# Patient Record
Sex: Male | Born: 1945 | Race: White | Hispanic: No | Smoking: Current every day smoker
Health system: Southern US, Community
[De-identification: ages and names within clinical notes are randomized; demographics above are authoritative.]

## PROBLEM LIST (undated history)

## (undated) DIAGNOSIS — C801 Malignant (primary) neoplasm, unspecified: Secondary | ICD-10-CM

## (undated) DIAGNOSIS — C8330 Diffuse large B-cell lymphoma, unspecified site: Secondary | ICD-10-CM

## (undated) HISTORY — DX: Diffuse large B-cell lymphoma, unspecified site: C83.30

---

## 2010-08-19 ENCOUNTER — Other Ambulatory Visit (HOSPITAL_COMMUNITY): Payer: Self-pay | Admitting: Family Medicine

## 2010-08-19 ENCOUNTER — Ambulatory Visit (HOSPITAL_COMMUNITY)
Admission: RE | Admit: 2010-08-19 | Discharge: 2010-08-19 | Disposition: A | Discharge status: Home / Self Care | Payer: 59 | Source: Ambulatory Visit | Attending: Family Medicine | Admitting: Family Medicine

## 2010-08-19 DIAGNOSIS — M949 Disorder of cartilage, unspecified: Secondary | ICD-10-CM | POA: Insufficient documentation

## 2010-08-19 DIAGNOSIS — R0989 Other specified symptoms and signs involving the circulatory and respiratory systems: Secondary | ICD-10-CM | POA: Insufficient documentation

## 2010-08-19 DIAGNOSIS — M546 Pain in thoracic spine: Secondary | ICD-10-CM | POA: Insufficient documentation

## 2010-08-19 DIAGNOSIS — R109 Unspecified abdominal pain: Secondary | ICD-10-CM | POA: Insufficient documentation

## 2010-08-19 DIAGNOSIS — M899 Disorder of bone, unspecified: Secondary | ICD-10-CM | POA: Insufficient documentation

## 2010-08-19 DIAGNOSIS — M542 Cervicalgia: Secondary | ICD-10-CM

## 2012-12-22 ENCOUNTER — Ambulatory Visit (HOSPITAL_COMMUNITY)
Admission: RE | Admit: 2012-12-22 | Discharge: 2012-12-22 | Disposition: A | Discharge status: Home / Self Care | Payer: Medicare Other | Source: Ambulatory Visit | Attending: Family Medicine | Admitting: Family Medicine

## 2012-12-22 ENCOUNTER — Other Ambulatory Visit (HOSPITAL_COMMUNITY): Payer: Self-pay | Admitting: Family Medicine

## 2012-12-22 DIAGNOSIS — M25551 Pain in right hip: Secondary | ICD-10-CM

## 2012-12-22 DIAGNOSIS — M25559 Pain in unspecified hip: Secondary | ICD-10-CM | POA: Insufficient documentation

## 2013-02-04 ENCOUNTER — Emergency Department (HOSPITAL_COMMUNITY)
Admission: EM | Admit: 2013-02-04 | Discharge: 2013-02-05 | Disposition: A | Discharge status: Home / Self Care | Payer: Medicare Other | Attending: Emergency Medicine | Admitting: Emergency Medicine

## 2013-02-04 ENCOUNTER — Emergency Department (HOSPITAL_COMMUNITY): Payer: Medicare Other

## 2013-02-04 ENCOUNTER — Encounter (HOSPITAL_COMMUNITY): Payer: Self-pay | Admitting: Emergency Medicine

## 2013-02-04 DIAGNOSIS — M5430 Sciatica, unspecified side: Secondary | ICD-10-CM | POA: Insufficient documentation

## 2013-02-04 DIAGNOSIS — M5431 Sciatica, right side: Secondary | ICD-10-CM

## 2013-02-04 DIAGNOSIS — F17200 Nicotine dependence, unspecified, uncomplicated: Secondary | ICD-10-CM | POA: Insufficient documentation

## 2013-02-04 NOTE — ED Notes (Signed)
Persistent pain right hip for 2 months, has been on prednisone therapy since 12/14. Pain increased and hasn't been able to sleep

## 2013-02-05 NOTE — Discharge Instructions (Signed)
X-ray was okay .  You have sciatica.   Phone number for Dr. Aline Brochure  given locally.   You can also call  [place] [facility] for followup.

## 2013-02-05 NOTE — ED Provider Notes (Signed)
CSN: [id number]     Arrival date & time 02/04/13  2235 History  This chart was scribed for Nat Christen, MD by Jenne Campus, ED Scribe. This patient was seen in room [facility]2/[facility] and the patient's care was started at 12:03 AM.     Chief Complaint  Patient presents with  . Hip Pain    The history is provided by the patient. No language interpreter was used.    HPI Comments: Devin Zuniga is a 68 y.o. male who presents to the Emergency Department complaining of persistent right hip pain for the past 2 months. He states that f/u with Dr. Karie Kirks for the pain that was originally in the anterior thigh and was given IBU with improvement in the front. He now has posterior pain that radiates all the way down to the right foot. He states that he was then prescribed prednisone with improvement in the radiation, but he states that the hip is still getting "tender and stiff". He reports that 2 days ago he was out walking with a cane which worsened the pain. He has been using a walker as well with some improvement. He states the he was prescribed hydrocodone but does not like taking it due to the side effect of constipation.   History reviewed. No pertinent past medical history. History reviewed. No pertinent past surgical history. No family history on file. History  Substance Use Topics  . Smoking status: Current Every Day Smoker -- 0.50 packs/day  . Smokeless tobacco: Not on file  . Alcohol Use: No    Review of Systems  A complete 10 system review of systems was obtained and all systems are negative except as noted in the HPI and PMH.   Allergies  Review of patient's allergies indicates no known allergies.  Home Medications  No current outpatient prescriptions on file.  Triage Vitals: BP 152/95  Pulse 114  Temp(Src) 98.1 F (36.7 C) (Oral)  Resp 16  Ht 5\' 10"  (1.778 m)  Wt 165 lb (74.844 kg)  BMI 23.68 kg/m2  SpO2 100%  Physical Exam  Nursing note and vitals  reviewed. Constitutional: He is oriented to person, place, and time. He appears well-developed and well-nourished.  HENT:  Head: Normocephalic and atraumatic.  Eyes: Conjunctivae and EOM are normal.  Neck: Normal range of motion. Neck supple.  Cardiovascular: Normal rate.   Pulmonary/Chest: Effort normal.  Musculoskeletal: Normal range of motion.  Tender along the distrubution of the sciatic nerve on the right  Neurological: He is alert and oriented to person, place, and time.  Skin: Skin is warm and dry.  Psychiatric: He has a normal mood and affect. His behavior is normal.    ED Course  Procedures (including critical care time)  DIAGNOSTIC STUDIES: Oxygen Saturation is 100% on RA, normal by my interpretation.    COORDINATION OF CARE: 12:06 AM- Advised pt that his symptoms seem to be sciatica. Offered pt Toradol and sleeping mediation which pt declined. Advised pt that he needs to f/u with Orthopedics.   Labs Review Labs Reviewed - No data to display Imaging Review No results found.  EKG Interpretation   None       MDM  No diagnosis found. X-ray of right hip shows no acute pathology.  History and physical consistent with sciatica.  I personally performed the services described in this documentation, which was scribed in my presence. The recorded information has been reviewed and is accurate.    Nat Christen, MD 02/09/13 [phone]

## 2013-02-20 ENCOUNTER — Encounter: Payer: Self-pay | Admitting: Orthopedic Surgery

## 2013-02-20 ENCOUNTER — Ambulatory Visit (INDEPENDENT_AMBULATORY_CARE_PROVIDER_SITE_OTHER): Payer: Medicare Other | Admitting: Orthopedic Surgery

## 2013-02-20 ENCOUNTER — Ambulatory Visit (INDEPENDENT_AMBULATORY_CARE_PROVIDER_SITE_OTHER): Payer: Medicare Other

## 2013-02-20 VITALS — BP 121/82 | Ht 70.0 in | Wt 159.0 lb

## 2013-02-20 DIAGNOSIS — M25551 Pain in right hip: Secondary | ICD-10-CM

## 2013-02-20 DIAGNOSIS — M549 Dorsalgia, unspecified: Secondary | ICD-10-CM

## 2013-02-20 DIAGNOSIS — M25559 Pain in unspecified hip: Secondary | ICD-10-CM

## 2013-02-20 DIAGNOSIS — G5700 Lesion of sciatic nerve, unspecified lower limb: Secondary | ICD-10-CM

## 2013-02-20 DIAGNOSIS — G5701 Lesion of sciatic nerve, right lower limb: Secondary | ICD-10-CM

## 2013-02-20 NOTE — Patient Instructions (Addendum)
You have received a steroid shot. 15% of patients experience increased pain at the injection site with in the next 24 hours. This is best treated with ice and tylenol extra strength 2 tabs every 8 hours. If you are still having pain please call the office.  Piriformis Syndrome   Piriformis syndrome is a condition the affects the nervous system in the area of the hip, and is characterized by pain and possibly a loss of feeling in the backside (posterior) thigh that may extend down the entire length of the leg. The symptoms are caused by an increase in pressure on the sciatic nerve by the piriformis muscle, which is on the back of the hip and is responsible for externally rotating the hip. The sciatic nerve and its branches connect to much of the leg. Normally the sciatic nerve runs between the piriformis muscle and other muscles. However, in certain individuals the nerve runs through the muscle, which causes an increase in pressure on the nerve and results in the symptoms of piriformis syndrome. SYMPTOMS   Pain, tingling, numbness, or burning in the back of the thigh that may also extend down the entire leg.  Occasionally, tenderness in the buttock.  Loss of function of the leg.  Pain that worsens when using the piriformis muscle (running, jumping, or stairs).  Pain that increases with prolonged sitting.  Pain that is lessened by laying flat on the back. CAUSES   Piriformis syndrome is the result of an increase in pressure placed on the sciatic nerve. Often times piriformis syndrome is an overuse injury.  Stress placed on the nerve from a sudden increase in the intensity, frequency, or duration of training.  Compensation of other extremity injuries. RISK INCREASES WITH:  Sports that involve the piriformis muscle (running, walking or jumping).  You are born with (congenital) a defect in which the sciatic nerve passes through the muscle. PREVENTION  Warm up and stretch properly before  activity.  Allow for adequate recovery between workouts.  Maintain physical fitness:  Strength, flexibility, and endurance.  Cardiovascular fitness. PROGNOSIS  If treated properly, then the symptoms of piriformis syndrome usually resolve in 2 to 6 weeks. RELATED COMPLICATIONS   Persistent and possibly permanent pain and numbness in the lower extremity.  Weakness of the extremity that may progress to disability and inability to compete. TREATMENT  The most effective treatment for piriformis syndrome is rest from any activities that aggravate the symptoms. Ice and pain medication may help reduce pain and inflammation. The use of strengthening and stretching exercises may help reduce pain with activity. These exercises may be performed at home or with a therapist. A referral to a therapist may be given for further evaluation and treatment, such as ultrasound. Corticosteroid injections may be given to reduce inflammation that is causing pressure to be placed on the sciatic nerve. If non-surgical (conservative) treatment is unsuccessful, then surgery may be recommended.  MEDICATION   If pain medication is necessary, then nonsteroidal anti-inflammatory medications, such as aspirin and ibuprofen, or other minor pain relievers, such as acetaminophen, are often recommended.  Do not take pain medication for 7 days before surgery.  Prescription pain relievers may be given if deemed necessary by your caregiver. Use only as directed and only as much as you need.  Corticosteroid injections may be given by your caregiver. These injections should be reserved for the most serious cases, because they may only be given a certain number of times. HEAT AND COLD:   Cold treatment (  icing) relieves pain and reduces inflammation. Cold treatment should be applied for 10 to 15 minutes every 2 to 3 hours for inflammation and pain and immediately after any activity that aggravates your symptoms. Use ice packs or  massage the area with a piece of ice (ice massage).  Heat treatment may be used prior to performing the stretching and strengthening activities prescribed by your caregiver, physical therapist, or athletic trainer. Use a heat pack or soak the injury in warm water. SEEK IMMEDIATE MEDICAL CARE IF:  Treatment seems to offer no benefit, or the condition worsens.  Any medications produce adverse side effects.

## 2013-02-20 NOTE — Progress Notes (Signed)
Patient ID: Devin Zuniga, male   DOB: 1945-05-09, 68 y.o.   MRN: [id number] Chief Complaint  Patient presents with  . Hip Pain    Right hip pain, no injury. Consult from Dr. Karie Kirks    This is a 68 year old male no history of surgery no major medical problems who presents with a five-month history of pain in his right hip over the posterior lateral hip area and the greater trochanteric region and gluteal area which started after he did some yard work. He was treated with prednisone ibuprofen and hydrocodone. He did notice some relief until his prednisone taper was finished. He's had an x-ray of his hip in October again in January he has normal hip joints. He does not have groin pain her anterior thigh pain at this time his pain is confined to the greater trochanter just posterior to it in the area of the puriform [place]. He denies any numbness or tingling of his right lower extremity but he has noticed that flexing his hip and bending his knee does give him some relief  System review negative  Family history negative social history widowed she does smoke 5 or 6 cigars per day he does not drink  BP 121/82  Ht 5\' 10"  (1.778 m)  Wt 159 lb (72.122 kg)  BMI 22.81 kg/m2 This is a very nice gentleman he has an ectomorphic body habitus he appears to have good nutrition no developmental abnormalities he is well-groomed his pulses are good is extremities are warm to touch is no lymphadenopathy in the groin his skin is warm dry and intact and he is head and neck trunk area left and right lower extremities. He ambulates well he has a smooth gait and normal coordination of his ambulatory status except for the fact that he see it appears to limp on the right side reflexes and nerve stretch tests are normal he has normal sensation by touch and proprioception he is awake alert and oriented x3 his mood and affect are normal. He has no tenderness in his back he appears at least flexed lumbosacral junction  His left hip  on inspection looks normal nontender, range of motion is full, stability tests were normal and muscle strength and tone were normal  On the right side and he does have discomfort when we flattened his hip into full extension in his knee into extension. The stretch test was negative. He did have tenderness over the performance muscle and pain with internal rotation when lying on his side. Muscle tone was normal strength was normal stability tests were normal  X-rays reviewed include x-ray from the hospital in October another one in January and we took a series of lumbar spine films which shows extensive osteopenia but without significant disc space narrowing. We do see degenerative changes and abnormal coronal plane alignment but he is so osteopenic that it is difficult to assess his lumbar spine  Differential diagnosis includes degenerative disc disease, puriform syndrome, sciatica.  We injected him in the puriform  tendon area and we will see him back in 2 weeks to see if this helped  Injection right hip injection  Verbal consent was given and timeout to confirm procedure was completed  Medications used Depo-Medrol 40 mg and lidocaine 1% 3 cc  Technique of injection the skin was cleaned with alcohol and then sprayed with ethyl chloride a 25-gauge needle was used to inject the medication. There were no complications. A bandage was applied.

## 2013-03-13 ENCOUNTER — Encounter: Payer: Self-pay | Admitting: Orthopedic Surgery

## 2013-03-13 ENCOUNTER — Ambulatory Visit (INDEPENDENT_AMBULATORY_CARE_PROVIDER_SITE_OTHER): Payer: Medicare Other | Admitting: Orthopedic Surgery

## 2013-03-13 VITALS — BP 157/92 | Ht 70.0 in | Wt 159.0 lb

## 2013-03-13 DIAGNOSIS — M25551 Pain in right hip: Secondary | ICD-10-CM

## 2013-03-13 DIAGNOSIS — M25559 Pain in unspecified hip: Secondary | ICD-10-CM

## 2013-03-13 DIAGNOSIS — M549 Dorsalgia, unspecified: Secondary | ICD-10-CM

## 2013-03-13 NOTE — Progress Notes (Signed)
Patient ID: Devin Zuniga, male   DOB: [birth year], 68 y.o.   MRN: [id number] Chief Complaint  Patient presents with  . Follow-up    2 week recheck right hip s/p injection    BP 157/92  Ht 5\' 10"  (1.778 m)  Wt 159 lb (72.122 kg)  BMI 22.81 kg/m2  The patient reports injection has helped him immensely. He is walking better though still with a limp and still with a slightly flexed lumbosacral junction  He does have pain over the SI joint and gluteal area he put a Deformity day. Denies numbness and tingling. He's able to move his leg better and he can straighten his hip down and bring it into extension  I watched him walk he does have a slight limp the knees flexed slightly at the spine  Vital signs stable appearance is normal is oriented x3 his mood and affect are normal his gait as described as tenderness near the pier form is insertion. Skin is intact over this area.  Inject right hip IM injection  Sterile technique verbal consent and timeout completed  Alcohol prep ethyl chloride for anesthesia 40 mg of Depo-Medrol and 3 cc 1% lidocaine injected without complication followup in 2 weeks

## 2013-03-13 NOTE — Patient Instructions (Signed)
You have received a steroid shot. 15% of patients experience increased pain at the injection site with in the next 24 hours. This is best treated with ice and tylenol extra strength 2 tabs every 8 hours. If you are still having pain please call the office.    

## 2013-03-19 ENCOUNTER — Telehealth: Payer: Self-pay | Admitting: Orthopedic Surgery

## 2013-03-19 NOTE — Telephone Encounter (Signed)
Devin Zuniga said he got an injection in his right hip 03/13/13, and since then he has felt tightness in the upper part of his right leg, but today he has numbness there. He asked if this is normal.  He has been elevating and taking Ibuprofen.   Any further advice?

## 2013-03-21 NOTE — Telephone Encounter (Signed)
CONTINUE PRESENT COURSE

## 2013-03-21 NOTE — Telephone Encounter (Signed)
Called patient and advised him of Dr. Ruthe Mannan reply. Patient has follow up appointment 03/29/13.

## 2013-03-23 ENCOUNTER — Telehealth: Payer: Self-pay | Admitting: *Deleted

## 2013-03-23 NOTE — Telephone Encounter (Signed)
Patient called today with c/o hip still hurting. I advised him that Dr. Aline Brochure was out of the office today. He has an appointment Thursday 03/29/13. I advised him to keep that appointment, and I told him some of his arthritis pain may be worse from the extreme cold weather. He stated that DR. Karie Kirks had given him some hydrocodone, and he was taking those, which seemed to help.

## 2013-03-28 ENCOUNTER — Telehealth: Payer: Self-pay | Admitting: *Deleted

## 2013-03-28 ENCOUNTER — Other Ambulatory Visit (HOSPITAL_COMMUNITY): Payer: Self-pay | Admitting: Family Medicine

## 2013-03-28 ENCOUNTER — Ambulatory Visit (HOSPITAL_COMMUNITY)
Admission: RE | Admit: 2013-03-28 | Discharge: 2013-03-28 | Disposition: A | Discharge status: Home / Self Care | Payer: Medicare Other | Source: Ambulatory Visit | Attending: Family Medicine | Admitting: Family Medicine

## 2013-03-28 DIAGNOSIS — M7989 Other specified soft tissue disorders: Secondary | ICD-10-CM

## 2013-03-28 DIAGNOSIS — M79604 Pain in right leg: Secondary | ICD-10-CM

## 2013-03-28 DIAGNOSIS — M79609 Pain in unspecified limb: Secondary | ICD-10-CM | POA: Insufficient documentation

## 2013-03-28 DIAGNOSIS — R599 Enlarged lymph nodes, unspecified: Secondary | ICD-10-CM | POA: Insufficient documentation

## 2013-03-28 NOTE — Telephone Encounter (Signed)
Patient came in today complaining of right leg swelling.Marland Kitchen He has an appointment for Thursday 03/29/13, but he is concerned with the inclement weather they are calling for he may not be able to make it. He wanted to be seen today, however, Dr. Aline Brochure did not have anything due to he has a full schedule and  a 1:00 surgery. I did look at his leg, and there is a significant amount of swelling in his right leg, no redness noted.  I advised him to see his primary care doctor today or go to the ER to have this checked out.

## 2013-03-29 ENCOUNTER — Ambulatory Visit: Payer: Medicare Other | Admitting: Orthopedic Surgery

## 2013-03-30 ENCOUNTER — Encounter (HOSPITAL_COMMUNITY): Payer: Self-pay | Admitting: Emergency Medicine

## 2013-03-30 ENCOUNTER — Emergency Department (HOSPITAL_COMMUNITY): Payer: Medicare Other

## 2013-03-30 ENCOUNTER — Emergency Department (HOSPITAL_COMMUNITY)
Admission: EM | Admit: 2013-03-30 | Discharge: 2013-03-30 | Disposition: A | Discharge status: Home / Self Care | Payer: Medicare Other | Attending: Emergency Medicine | Admitting: Emergency Medicine

## 2013-03-30 DIAGNOSIS — F17200 Nicotine dependence, unspecified, uncomplicated: Secondary | ICD-10-CM | POA: Insufficient documentation

## 2013-03-30 DIAGNOSIS — M7989 Other specified soft tissue disorders: Secondary | ICD-10-CM | POA: Insufficient documentation

## 2013-03-30 LAB — CBC WITH DIFFERENTIAL/PLATELET
Basophils Absolute: 0 10*3/uL (ref 0.0–0.1)
Basophils Relative: 0 % (ref 0–1)
EOS PCT: 1 % (ref 0–5)
Eosinophils Absolute: 0.2 10*3/uL (ref 0.0–0.7)
HEMATOCRIT: 41.8 % (ref 39.0–52.0)
HEMOGLOBIN: 13.3 g/dL (ref 13.0–17.0)
LYMPHS ABS: 2.1 10*3/uL (ref 0.7–4.0)
LYMPHS PCT: 20 % (ref 12–46)
MCH: 27.9 pg (ref 26.0–34.0)
MCHC: 31.8 g/dL (ref 30.0–36.0)
MCV: 87.8 fL (ref 78.0–100.0)
MONO ABS: 0.6 10*3/uL (ref 0.1–1.0)
MONOS PCT: 6 % (ref 3–12)
Neutro Abs: 7.7 10*3/uL (ref 1.7–7.7)
Neutrophils Relative %: 73 % (ref 43–77)
Platelets: 348 10*3/uL (ref 150–400)
RBC: 4.76 MIL/uL (ref 4.22–5.81)
RDW: 14.7 % (ref 11.5–15.5)
WBC: 10.5 10*3/uL (ref 4.0–10.5)

## 2013-03-30 LAB — COMPREHENSIVE METABOLIC PANEL
ALT: 8 U/L (ref 0–53)
AST: 14 U/L (ref 0–37)
Albumin: 3.4 g/dL — ABNORMAL LOW (ref 3.5–5.2)
Alkaline Phosphatase: 98 U/L (ref 39–117)
BILIRUBIN TOTAL: 0.3 mg/dL (ref 0.3–1.2)
BUN: 10 mg/dL (ref 6–23)
CALCIUM: 9.5 mg/dL (ref 8.4–10.5)
CHLORIDE: 100 meq/L (ref 96–112)
CO2: 29 meq/L (ref 19–32)
CREATININE: 0.79 mg/dL (ref 0.50–1.35)
GLUCOSE: 96 mg/dL (ref 70–99)
Potassium: 3.8 mEq/L (ref 3.7–5.3)
Sodium: 141 mEq/L (ref 137–147)
Total Protein: 7.6 g/dL (ref 6.0–8.3)

## 2013-03-30 MED ORDER — IOHEXOL 300 MG/ML  SOLN
100.0000 mL | Freq: Once | INTRAMUSCULAR | Status: AC | PRN
  Administered 2013-03-30: 100 mL via INTRAVENOUS

## 2013-03-30 NOTE — ED Notes (Signed)
Pt states he left Dr. Yvonna Alanis office and was told to come here to get checked. States he has had his leg elevated and taking motrin

## 2013-03-30 NOTE — Discharge Instructions (Signed)
Take daily baby aspirin.  Follow up with Dr. Karie Kirks at 10 AM on Monday morning.

## 2013-03-30 NOTE — ED Provider Notes (Signed)
CSN: [id number]     Arrival date & time 03/30/13  1305 History   First MD Initiated Contact with Patient 03/30/13 1507     Chief Complaint  Patient presents with  . Leg Swelling     (Consider location/radiation/quality/duration/timing/severity/associated sxs/prior Treatment) HPI ... right leg swelling for approximately one week. Patient seen by primary care Dr. Today.   Doppler study of right lower extremity negative. No chest pain, dyspnea, fever, sweats, chills, weight loss. No chronic medical problems. Severity is moderate to severe. Nothing makes symptoms better or worse.   History reviewed. No pertinent past medical history. History reviewed. No pertinent past surgical history. No family history on file. History  Substance Use Topics  . Smoking status: Current Every Day Smoker -- 0.50 packs/day  . Smokeless tobacco: Not on file  . Alcohol Use: No    Review of Systems  All other systems reviewed and are negative.      Allergies  Review of patient's allergies indicates no known allergies.  Home Medications   Current Outpatient Rx  Name  Route  Sig  Dispense  Refill  . HYDROcodone-acetaminophen (NORCO) 10-325 MG per tablet   Oral   Take 1 tablet by mouth every 6 (six) hours as needed. pain         . ibuprofen (ADVIL,MOTRIN) 200 MG tablet   Oral   Take 400 mg by mouth every 6 (six) hours as needed. pain          BP 156/104  Pulse 107  Temp(Src) 97.7 F (36.5 C) (Oral)  Resp 20  Ht 5\' 10"  (1.778 m)  Wt 158 lb (71.668 kg)  BMI 22.67 kg/m2  SpO2 100% Physical Exam  Nursing note and vitals reviewed. Constitutional: He is oriented to person, place, and time. He appears well-developed and well-nourished.  HENT:  Head: Normocephalic and atraumatic.  Eyes: Conjunctivae and EOM are normal. Pupils are equal, round, and reactive to light.  Neck: Normal range of motion. Neck supple.  Cardiovascular: Normal rate, regular rhythm and normal heart sounds.    Pulmonary/Chest: Effort normal and breath sounds normal.  Abdominal: Soft. Bowel sounds are normal.  Musculoskeletal:  Right lower extremity tense and puffy from hip to foot. No compartment syndrome  Neurological: He is alert and oriented to person, place, and time.  Skin: Skin is warm and dry.  Psychiatric: He has a normal mood and affect. His behavior is normal.    ED Course  Procedures (including critical care time) Labs Review Labs Reviewed  COMPREHENSIVE METABOLIC PANEL - Abnormal; Notable for the following:    Albumin 3.4 (*)    All other components within normal limits  CBC WITH DIFFERENTIAL   Imaging Review Ct Abdomen Pelvis W Contrast  03/30/2013   CLINICAL DATA:  Right hip and thigh pain  EXAM: CT ABDOMEN AND PELVIS WITH CONTRAST  TECHNIQUE: Multidetector CT imaging of the abdomen and pelvis was performed using the standard protocol following bolus administration of intravenous contrast.  CONTRAST:  116mL OMNIPAQUE IOHEXOL 300 MG/ML  SOLN  COMPARISON:  None.  FINDINGS: No pleural or pericardial effusion identified. The lung bases are clear. Mild changes of centrilobular emphysema identified. .  1.9 cm low attenuation structure within the left hepatic lobe is indeterminate, image 16/series 2. There is a indeterminate low attenuation structure in the right hepatic lobe measuring 1.3 cm, image 18/series 2. Within the medial aspect of the posterior right hepatic lobe there is a 1.7 x 2.2 cm indeterminate low attenuation  lesion, image 16/series 2. The gallbladder appears normal. No biliary dilatation. Normal appearance of the pancreas. The spleen is normal in size measuring 11 cm. There are several foci of low attenuation within the spleen. The largest 3.5 cm, image 40/series 5.  The adrenal glands are both normal. Normal appearance of both kidneys. The urinary bladder is normal.  Calcification within the prostate gland is noted.  Calcified atherosclerotic disease involves the abdominal  aorta. There is no aneurysm. Periaortic lymph node/soft tissue measures 1.8 cm, image 46/series 2. There is a 1 cm aortocaval lymph node, image 44/series 2. Multiple enlarged right iliac lymph nodes are identified. Index right common iliac lymph node measures 1.8 cm, image 53/series 2. Large right external iliac lymph node measures 3.6 cm, image 67/series 2. Review of the bony structures shows definite evidence for lytic or sclerotic bone lesion.  There is no ascites or focal fluid collections identified within the abdomen or pelvis. The stomach appears normal. The small bowel loops have a normal course and caliber and there is no evidence for obstruction. The colon is on unremarkable.  Hyperdense mass within the medial aspect of the right upper thigh a measures 4.5 x 5.6 cm, image 88/series 2.  IMPRESSION: 1. Multifocal areas of low attenuation within the liver and spleen are worrisome for metastatic disease. 2. Enlarged retroperitoneal and right iliac lymph nodes are worrisome for metastatic adenopathy. 3. Indeterminate mass within the medial aspect of the right upper thigh is also favored to represent a focus of metastatic disease. 4. Further evaluation with PET-CT and tissue sampling is recommended.   Electronically Signed   By: Kerby Moors M.D.   On: 03/30/2013 17:19     EKG Interpretation None      MDM   Final diagnoses:  Swelling of right lower extremity    CT abdomen and pelvis suspicious for metastatic disease. These findings were discussed with the patient and with Dr. Karie Kirks. He will see the patient on Monday for further workup.    Nat Christen, MD 03/30/13 [phone]

## 2013-04-02 ENCOUNTER — Other Ambulatory Visit (HOSPITAL_COMMUNITY): Payer: Self-pay | Admitting: Oncology

## 2013-04-02 DIAGNOSIS — R591 Generalized enlarged lymph nodes: Secondary | ICD-10-CM

## 2013-04-05 ENCOUNTER — Telehealth: Payer: Self-pay | Admitting: Orthopedic Surgery

## 2013-04-05 NOTE — Telephone Encounter (Signed)
Called patient 04/04/13 to offer re-schedule date for appointment of 03/29/13, cancelled due to weather; patient relates that he has other medical issues, and in the midst of having several tests; states most recently underwent testing for a "mass".  He will call back about re-scheduling when he is able to.

## 2013-04-12 ENCOUNTER — Other Ambulatory Visit (HOSPITAL_COMMUNITY): Payer: Self-pay | Admitting: Hematology and Oncology

## 2013-04-12 ENCOUNTER — Ambulatory Visit (HOSPITAL_COMMUNITY)
Admission: RE | Admit: 2013-04-12 | Discharge: 2013-04-12 | Disposition: A | Discharge status: Home / Self Care | Payer: Medicare Other | Source: Ambulatory Visit | Attending: Oncology | Admitting: Oncology

## 2013-04-12 DIAGNOSIS — C771 Secondary and unspecified malignant neoplasm of intrathoracic lymph nodes: Secondary | ICD-10-CM | POA: Insufficient documentation

## 2013-04-12 DIAGNOSIS — D739 Disease of spleen, unspecified: Secondary | ICD-10-CM | POA: Insufficient documentation

## 2013-04-12 DIAGNOSIS — C7951 Secondary malignant neoplasm of bone: Secondary | ICD-10-CM | POA: Insufficient documentation

## 2013-04-12 DIAGNOSIS — C772 Secondary and unspecified malignant neoplasm of intra-abdominal lymph nodes: Secondary | ICD-10-CM | POA: Insufficient documentation

## 2013-04-12 DIAGNOSIS — R590 Localized enlarged lymph nodes: Secondary | ICD-10-CM

## 2013-04-12 DIAGNOSIS — R591 Generalized enlarged lymph nodes: Secondary | ICD-10-CM

## 2013-04-12 DIAGNOSIS — C801 Malignant (primary) neoplasm, unspecified: Secondary | ICD-10-CM | POA: Insufficient documentation

## 2013-04-12 DIAGNOSIS — K869 Disease of pancreas, unspecified: Secondary | ICD-10-CM | POA: Insufficient documentation

## 2013-04-12 DIAGNOSIS — C7952 Secondary malignant neoplasm of bone marrow: Secondary | ICD-10-CM

## 2013-04-12 DIAGNOSIS — C775 Secondary and unspecified malignant neoplasm of intrapelvic lymph nodes: Secondary | ICD-10-CM | POA: Insufficient documentation

## 2013-04-12 LAB — GLUCOSE, CAPILLARY: Glucose-Capillary: 102 mg/dL — ABNORMAL HIGH (ref 70–99)

## 2013-04-12 MED ORDER — FLUDEOXYGLUCOSE F - 18 (FDG) INJECTION
9.1000 | Freq: Once | INTRAVENOUS | Status: AC | PRN
  Administered 2013-04-12: 9.1 via INTRAVENOUS

## 2013-04-13 ENCOUNTER — Other Ambulatory Visit: Payer: Self-pay | Admitting: Radiology

## 2013-04-14 DIAGNOSIS — R591 Generalized enlarged lymph nodes: Secondary | ICD-10-CM | POA: Insufficient documentation

## 2013-04-16 ENCOUNTER — Encounter (HOSPITAL_COMMUNITY): Payer: Medicare Other | Attending: Hematology and Oncology

## 2013-04-16 ENCOUNTER — Encounter (HOSPITAL_COMMUNITY): Payer: Self-pay

## 2013-04-16 ENCOUNTER — Encounter (HOSPITAL_COMMUNITY): Payer: Self-pay | Admitting: Pharmacy Technician

## 2013-04-16 VITALS — BP 142/91 | HR 99 | Temp 98.5°F | Resp 20 | Ht 69.5 in | Wt 163.1 lb

## 2013-04-16 DIAGNOSIS — C778 Secondary and unspecified malignant neoplasm of lymph nodes of multiple regions: Secondary | ICD-10-CM

## 2013-04-16 DIAGNOSIS — R590 Localized enlarged lymph nodes: Secondary | ICD-10-CM

## 2013-04-16 DIAGNOSIS — J449 Chronic obstructive pulmonary disease, unspecified: Secondary | ICD-10-CM | POA: Insufficient documentation

## 2013-04-16 DIAGNOSIS — C801 Malignant (primary) neoplasm, unspecified: Secondary | ICD-10-CM

## 2013-04-16 DIAGNOSIS — C7952 Secondary malignant neoplasm of bone marrow: Secondary | ICD-10-CM

## 2013-04-16 DIAGNOSIS — C7951 Secondary malignant neoplasm of bone: Secondary | ICD-10-CM

## 2013-04-16 DIAGNOSIS — R599 Enlarged lymph nodes, unspecified: Secondary | ICD-10-CM | POA: Insufficient documentation

## 2013-04-16 DIAGNOSIS — R591 Generalized enlarged lymph nodes: Secondary | ICD-10-CM

## 2013-04-16 DIAGNOSIS — J4489 Other specified chronic obstructive pulmonary disease: Secondary | ICD-10-CM | POA: Insufficient documentation

## 2013-04-16 LAB — LACTATE DEHYDROGENASE: LDH: 397 U/L — ABNORMAL HIGH (ref 94–250)

## 2013-04-16 LAB — COMPREHENSIVE METABOLIC PANEL
ALK PHOS: 95 U/L (ref 39–117)
ALT: 6 U/L (ref 0–53)
AST: 14 U/L (ref 0–37)
Albumin: 3.1 g/dL — ABNORMAL LOW (ref 3.5–5.2)
BUN: 11 mg/dL (ref 6–23)
CALCIUM: 9.2 mg/dL (ref 8.4–10.5)
CO2: 27 mEq/L (ref 19–32)
Chloride: 103 mEq/L (ref 96–112)
Creatinine, Ser: 0.71 mg/dL (ref 0.50–1.35)
GFR calc non Af Amer: >90 mL/min (ref >90)
GLUCOSE: 92 mg/dL (ref 70–99)
POTASSIUM: 4.3 meq/L (ref 3.7–5.3)
SODIUM: 140 meq/L (ref 137–147)
TOTAL PROTEIN: 7.3 g/dL (ref 6.0–8.3)
Total Bilirubin: 0.4 mg/dL (ref 0.3–1.2)

## 2013-04-16 LAB — CBC WITH DIFFERENTIAL/PLATELET
BASOS ABS: 0 10*3/uL (ref 0.0–0.1)
Basophils Relative: 0 % (ref 0–1)
EOS ABS: 0 10*3/uL (ref 0.0–0.7)
Eosinophils Relative: 0 % (ref 0–5)
HCT: 41.8 % (ref 39.0–52.0)
Hemoglobin: 13.2 g/dL (ref 13.0–17.0)
LYMPHS ABS: 1.5 10*3/uL (ref 0.7–4.0)
LYMPHS PCT: 13 % (ref 12–46)
MCH: 27.6 pg (ref 26.0–34.0)
MCHC: 31.6 g/dL (ref 30.0–36.0)
MCV: 87.4 fL (ref 78.0–100.0)
Monocytes Absolute: 1 10*3/uL (ref 0.1–1.0)
Monocytes Relative: 8 % (ref 3–12)
NEUTROS PCT: 78 % — AB (ref 43–77)
Neutro Abs: 9.3 10*3/uL — ABNORMAL HIGH (ref 1.7–7.7)
PLATELETS: 443 10*3/uL — AB (ref 150–400)
RBC: 4.78 MIL/uL (ref 4.22–5.81)
RDW: 15 % (ref 11.5–15.5)
WBC: 11.8 10*3/uL — AB (ref 4.0–10.5)

## 2013-04-16 MED ORDER — HYDROCODONE-ACETAMINOPHEN 10-325 MG PO TABS: ORAL_TABLET | ORAL | Status: DC

## 2013-04-16 NOTE — Progress Notes (Signed)
Devin Zuniga Glasgow, M.D.  NEW PATIENT EVALUATION   Name: Devin Zuniga Date: 04/16/2013 MRN: [id number] DOB: 11-16-1945  PCP: Devin Bellow, MD   REFERRING PHYSICIAN: [name], MD  REASON FOR REFERRAL: Diffuse lymphadenopathy.     HISTORY OF PRESENT ILLNESS:Devin Zuniga is a 68 y.o. male who is referred by family physician because of diffuse lymphadenopathy on the CT scan performed in the emergency room on 03/30/2013 when he presented with right lower extremity swelling.. Doppler study was negative for deep venous thrombosis. He has trouble sleeping because of pain and swelling or lower 70 extending into the right groin and scrotum. He denies urinary hesitancy, hematuria, incontinence, fever, or night sweats. He also denies easy satiety, anorexia, cough, wheezing, expectoration, hemoptysis, melena, hematochezia, epistaxis, extremity swelling other than the right lower, skin rash, headache, or seizures.   PAST MEDICAL HISTORY:  has no past medical history on file.     PAST SURGICAL HISTORY:History reviewed. No pertinent past surgical history.   CURRENT MEDICATIONS: has a current medication list which includes the following prescription(s): capsaicin, hydrocodone-acetaminophen, ibuprofen, liniments, and polyethylene glycol 3350.   ALLERGIES: Review of patient's allergies indicates no known allergies.   SOCIAL HISTORY:  reports that he has been smoking Cigarettes.  He has been smoking about 0.25 packs per day. He does not have any smokeless tobacco history on file. He reports that he does not drink alcohol or use illicit drugs.   FAMILY HISTORY: family history is not on file.    REVIEW OF SYSTEMS:  Other than that discussed above is noncontributory.    PHYSICAL EXAM:  height is 5' 9.5" (1.765 m) and weight is 163 lb 1.6 oz (73.982 kg). His oral temperature is 98.5 F (36.9 C). His blood pressure is 142/91  and his pulse is 99. His respiration is 20.    GENERAL:alert, no distress and comfortable SKIN: skin color, texture, turgor are normal, no rashes or significant lesions EYES: normal, Conjunctiva are pink and non-injected, sclera clear OROPHARYNX:no exudate, no erythema and lips, buccal mucosa, and tongue normal  NECK: supple, thyroid normal size, non-tender, without nodularity CHEST: Increased AP diameter with no gynecomastia. LYMPH: Bilateral axillary and inguinal lymphadenopathy. Right axillary mass is about 3 cm in size. LUNGS: clear to auscultation and percussion with normal breathing effort HEART: regular rate & rhythm and no murmurs ABDOMEN:abdomen soft, non-tender and normal bowel sounds MUSCULOSKELETALl:no cyanosis of digits, no clubbing or edema right lower extremity edema 2 times the size of the left lower extremity with inguinal adenopathy bilaterally, bilateral axillary adenopathy, with negative Homans sign. NEURO: alert & oriented x 3 with fluent speech, no focal motor/sensory deficits    LABORATORY DATA:  Hospital Outpatient Visit on 04/12/2013  Component Date Value Ref Range Status  . Glucose-Capillary 04/12/2013 102* 70 - 99 mg/dL Final  Admission on 03/30/2013, Discharged on 03/30/2013  Component Date Value Ref Range Status  . WBC 03/30/2013 10.5  4.0 - 10.5 K/uL Final  . RBC 03/30/2013 4.76  4.22 - 5.81 MIL/uL Final  . Hemoglobin 03/30/2013 13.3  13.0 - 17.0 g/dL Final  . HCT 03/30/2013 41.8  39.0 - 52.0 % Final  . MCV 03/30/2013 87.8  78.0 - 100.0 fL Final  . MCH 03/30/2013 27.9  26.0 - 34.0 pg Final  . MCHC 03/30/2013 31.8  30.0 - 36.0 g/dL Final  . RDW 03/30/2013 14.7  11.5 - 15.5 % Final  .  Platelets 03/30/2013 348  150 - 400 K/uL Final  . Neutrophils Relative % 03/30/2013 73  43 - 77 % Final  . Neutro Abs 03/30/2013 7.7  1.7 - 7.7 K/uL Final  . Lymphocytes Relative 03/30/2013 20  12 - 46 % Final  . Lymphs Abs 03/30/2013 2.1  0.7 - 4.0 K/uL Final  . Monocytes  Relative 03/30/2013 6  3 - 12 % Final  . Monocytes Absolute 03/30/2013 0.6  0.1 - 1.0 K/uL Final  . Eosinophils Relative 03/30/2013 1  0 - 5 % Final  . Eosinophils Absolute 03/30/2013 0.2  0.0 - 0.7 K/uL Final  . Basophils Relative 03/30/2013 0  0 - 1 % Final  . Basophils Absolute 03/30/2013 0.0  0.0 - 0.1 K/uL Final  . Sodium 03/30/2013 141  137 - 147 mEq/L Final  . Potassium 03/30/2013 3.8  3.7 - 5.3 mEq/L Final  . Chloride 03/30/2013 100  96 - 112 mEq/L Final  . CO2 03/30/2013 29  19 - 32 mEq/L Final  . Glucose, Bld 03/30/2013 96  70 - 99 mg/dL Final  . BUN 03/30/2013 10  6 - 23 mg/dL Final  . Creatinine, Ser 03/30/2013 0.79  0.50 - 1.35 mg/dL Final  . Calcium 03/30/2013 9.5  8.4 - 10.5 mg/dL Final  . Total Protein 03/30/2013 7.6  6.0 - 8.3 g/dL Final  . Albumin 03/30/2013 3.4* 3.5 - 5.2 g/dL Final  . AST 03/30/2013 14  0 - 37 U/L Final  . ALT 03/30/2013 8  0 - 53 U/L Final  . Alkaline Phosphatase 03/30/2013 98  39 - 117 U/L Final  . Total Bilirubin 03/30/2013 0.3  0.3 - 1.2 mg/dL Final  . GFR calc non Af Amer 03/30/2013 >90  >90 mL/min Final  . GFR calc Af Amer 03/30/2013 >90  >90 mL/min Final   Comment: (NOTE)                          The eGFR has been calculated using the CKD EPI equation.                          This calculation has not been validated in all clinical situations.                          eGFR's persistently <90 mL/min signify possible Chronic Kidney                          Disease.    Urinalysis No results found for this basename: colorurine,  appearanceur,  labspec,  phurine,  glucoseu,  hgbur,  bilirubinur,  ketonesur,  proteinur,  urobilinogen,  nitrite,  leukocytesur      _0 : Ct Abdomen Pelvis W Contrast  03/30/2013   CLINICAL DATA:  Right hip and thigh pain  EXAM: CT ABDOMEN AND PELVIS WITH CONTRAST  TECHNIQUE: Multidetector CT imaging of the abdomen and pelvis was performed using the standard protocol following bolus administration of  intravenous contrast.  CONTRAST:  157m OMNIPAQUE IOHEXOL 300 MG/ML  SOLN  COMPARISON:  None.  FINDINGS: No pleural or pericardial effusion identified. The lung bases are clear. Mild changes of centrilobular emphysema identified. .  1.9 cm low attenuation structure within the left hepatic lobe is indeterminate, image 16/series 2. There is a indeterminate low attenuation structure in the right hepatic lobe measuring 1.3 cm, image  18/series 2. Within the medial aspect of the posterior right hepatic lobe there is a 1.7 x 2.2 cm indeterminate low attenuation lesion, image 16/series 2. The gallbladder appears normal. No biliary dilatation. Normal appearance of the pancreas. The spleen is normal in size measuring 11 cm. There are several foci of low attenuation within the spleen. The largest 3.5 cm, image 40/series 5.  The adrenal glands are both normal. Normal appearance of both kidneys. The urinary bladder is normal.  Calcification within the prostate gland is noted.  Calcified atherosclerotic disease involves the abdominal aorta. There is no aneurysm. Periaortic lymph node/soft tissue measures 1.8 cm, image 46/series 2. There is a 1 cm aortocaval lymph node, image 44/series 2. Multiple enlarged right iliac lymph nodes are identified. Index right common iliac lymph node measures 1.8 cm, image 53/series 2. Large right external iliac lymph node measures 3.6 cm, image 67/series 2. Review of the bony structures shows definite evidence for lytic or sclerotic bone lesion.  There is no ascites or focal fluid collections identified within the abdomen or pelvis. The stomach appears normal. The small bowel loops have a normal course and caliber and there is no evidence for obstruction. The colon is on unremarkable.  Hyperdense mass within the medial aspect of the right upper thigh a measures 4.5 x 5.6 cm, image 88/series 2.  IMPRESSION: 1. Multifocal areas of low attenuation within the liver and spleen are worrisome for  metastatic disease. 2. Enlarged retroperitoneal and right iliac lymph nodes are worrisome for metastatic adenopathy. 3. Indeterminate mass within the medial aspect of the right upper thigh is also favored to represent a focus of metastatic disease. 4. Further evaluation with PET-CT and tissue sampling is recommended.   Electronically Signed   By: Taylor  Stroud M.D.   On: 03/30/2013 17:19   Nm Pet Image Initial (pi) Skull Base To Thigh  04/12/2013   CLINICAL DATA:  Initial treatment strategy for lymphadenopathy and right femur mass.  EXAM: NUCLEAR MEDICINE PET SKULL BASE TO THIGH  TECHNIQUE: 9.1 mCi F-18 FDG was injected intravenously. Full-ring PET imaging was performed from the skull base to thigh after the radiotracer. CT data was obtained and used for attenuation correction and anatomic localization.  FASTING BLOOD GLUCOSE:  Value: 102 mg/dl  COMPARISON:  CT abdomen pelvis dated 03/30/2013  FINDINGS: NECK  No hypermetabolic lymph nodes in the neck.  CHEST  Widespread thoracic nodal metastases, including:  --1.4 cm short axis right supraclavicular/ subpectoral node (series 4/ image 49), max SUV 10.5  --3.0 cm short axis right axillary node (series 4/image 57), max SUV 11.8  --4.5 x 3.7 cm AP window nodal mass, partially necrotic (series 4/image 70), max SUV 9.1  --2.2 cm short axis subcarinal node (series 4/image 81), max SUV 11.4  --hypermetabolic bilateral hilar nodes, difficult to measure on unenhanced CT, max SUV 9.3  Lungs are notable for moderate centrilobular emphysematous changes. No suspicious pulmonary nodules.  ABDOMEN/PELVIS  No abnormal hypermetabolic activity within the liver and bilateral adrenal glands. Possible 11 mm left adrenal nodule (series 4/image 117), without convincing hypermetabolism on PET.  Focal hypermetabolism with soft tissue prominence superiorly along the distal pancreatic body/ tail (series 4/image 114), max SUV 7.7. Although not well visualized on CT, this appearance is  worrisome for primary pancreatic neoplasm or metastasis. Additional focus of hypermetabolism in the pancreatic head, max SUV 8.7 (PET image 109), although without corresponding CT abnormality on recent CT.  Multifocal hypermetabolic splenic lesions, max SUV 9.0, compatible with   metastases.  Multiple retroperitoneal/right pelvic lymph nodes, including  --1.7 cm short axis right common iliac node (series 4/ image 135), max SUV 12.1  -- 6.1 x 5.1 cm right external iliac nodal metastasis (series 4/ image 173), max SUV 13.3  SKELETON  Multifocal/widespread osseous metastases, including:  --right humeral head lesion without definite CT correlate, max SUV 8.7 (PET image 33), with additional hypermetabolism in the adjacent musculature  --suspected vague permeative lesion in the right femoral head, max SUV 12.7, with additional hypermetabolism in the adjacent musculature, particularly in the medial right thigh (series 4/image 203)  --additional hypermetabolic lesions in the left scapula, posterior left 12th rib, left posterior elements at L5, right iliac bone, and right sacrum  Multiple healing left lateral rib fractures, non FDG avid.  IMPRESSION: Widespread nodal metastases in the chest, abdomen, and pelvis, as described above. Consider percutaneous sampling of a dominant right axillary nodal for tissue characterization.  Additional hypermetabolic lesions in the pancreas and spleen, suspicious for metastases, although primary pancreatic neoplasm along the distal pancreatic body/tail is possible.  Multifocal osseous metastases, including dominant lesions in the right humeral head and right femoral head.  Multiple healing left rib fractures, non FDG avid.   Electronically Signed   By: Julian Hy M.D.   On: 04/12/2013 12:39   US Venous Img Lower Unilateral Right  03/28/2013   CLINICAL DATA:  Right leg pain and swelling.  EXAM: Right LOWER EXTREMITY VENOUS DOPPLER ULTRASOUND  TECHNIQUE: Gray-scale sonography with  graded compression, as well as color Doppler and duplex ultrasound, were performed to evaluate the deep venous system from the level of the common femoral vein through the popliteal and proximal calf veins. Spectral Doppler was utilized to evaluate flow at rest and with distal augmentation maneuvers.  COMPARISON:  None.  FINDINGS: Thrombus within deep veins:  None visualized.  Compressibility of deep veins:  Normal.  Duplex waveform respiratory phasicity:  Normal.  Duplex waveform response to augmentation:  Normal.  Venous reflux:  Not evaluated.  Other findings: The posterior tibial and peroneal veins appear patent where visualized below the knee.  Subcutaneous edema is visualized in the leg.  Mildly enlarged lymph node identified in the right groin region.  IMPRESSION: No evidence for DVT in the right lower extremity.  Mildly enlarged right inguinal lymph node.  Subcutaneous edema in the leg.   Electronically Signed   By: Misty Stanley M.D.   On: 03/28/2013 13:55    PATHOLOGY: core biopsy of right axillary lymph node scheduled for 04/18/2013.   IMPRESSION:  #1. 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 #2. Chronic obstructive pulmonary disease.   PLAN:  #1. Aleve 2 tablets every 12 hours instead of ibuprofen. #2. Hydrocodone 10/APAP 325 up to 2 every 4 hours to control pain particularly at bedtime. #3. Core biopsy right axillary lymph node scheduled on 04/18/2013 by interventional radiology. #4. Followup on 04/23/2013. Patient was told to call on Monday to make sure that final results of the biopsies back.  I appreciate the opportunity of sharing in his care.   Doroteo Bradford, MD 04/16/2013 4:06 PM

## 2013-04-16 NOTE — Patient Instructions (Signed)
[facility] Discharge Instructions  RECOMMENDATIONS MADE BY THE CONSULTANT AND ANY TEST RESULTS WILL BE SENT TO YOUR REFERRING PHYSICIAN.  EXAM FINDINGS BY THE PHYSICIAN TODAY AND SIGNS OR SYMPTOMS TO REPORT TO CLINIC OR PRIMARY PHYSICIAN: Exam and findings as discussed by Dr. Barnet Glasgow.  MEDICATIONS PRESCRIBED:  Hydrocodone one to two tablets every 4 hours as needed for pain (per Dr. Barnet Glasgow, start by taking 2 tablets at bedtime, then one tablet every 4 hours while awake to see if this relieves your pain; you may take up to two tablets every four hours as needed for pain).  Take Aleve 1 tablet twice a day.  It is safe to take these two medications together.  INSTRUCTIONS/FOLLOW-UP: Return to clinic Monday for follow-up after biopsy for results.  Please call first on Monday to make sure these results are available   Thank you for choosing [facility] to provide your oncology and hematology care.  To afford each patient quality time with our providers, please arrive at least 15 minutes before your scheduled appointment time.  With your help, our goal is to use those 15 minutes to complete the necessary work-up to ensure our physicians have the information they need to help with your evaluation and healthcare recommendations.    Effective January 1st, 2014, we ask that you re-schedule your appointment with our physicians should you arrive 10 or more minutes late for your appointment.  We strive to give you quality time with our providers, and arriving late affects you and other patients whose appointments are after yours.    Again, thank you for choosing [facility].  Our hope is that these requests will decrease the amount of time that you wait before being seen by our physicians.       _____________________________________________________________  Should you have questions after your visit to [facility], please contact our office at  [phone] between the hours of 8:30 a.m. and 5:00 p.m.  Voicemails left after 4:30 p.m. will not be returned until the following business day.  For prescription refill requests, have your pharmacy contact our office with your prescription refill request.

## 2013-04-17 NOTE — Progress Notes (Signed)
Devin Zuniga presented for labwork. Labs per MD order drawn via Peripheral Line 23 gauge needle inserted in right AC.  Good blood return present. Procedure without incident.  Needle removed intact. Patient tolerated procedure well.

## 2013-04-18 ENCOUNTER — Ambulatory Visit (HOSPITAL_COMMUNITY)
Admission: RE | Admit: 2013-04-18 | Discharge: 2013-04-18 | Disposition: A | Discharge status: Home / Self Care | Payer: Medicare Other | Source: Ambulatory Visit | Attending: Hematology and Oncology | Admitting: Hematology and Oncology

## 2013-04-18 ENCOUNTER — Telehealth (HOSPITAL_COMMUNITY): Payer: Self-pay | Admitting: *Deleted

## 2013-04-18 ENCOUNTER — Telehealth (HOSPITAL_COMMUNITY): Payer: Self-pay | Admitting: Hematology and Oncology

## 2013-04-18 DIAGNOSIS — R590 Localized enlarged lymph nodes: Secondary | ICD-10-CM

## 2013-04-18 DIAGNOSIS — C8589 Other specified types of non-Hodgkin lymphoma, extranodal and solid organ sites: Secondary | ICD-10-CM | POA: Insufficient documentation

## 2013-04-18 DIAGNOSIS — R599 Enlarged lymph nodes, unspecified: Secondary | ICD-10-CM | POA: Insufficient documentation

## 2013-04-18 LAB — BETA 2 MICROGLOBULIN, SERUM: Beta-2 Microglobulin: 3.25 mg/L — ABNORMAL HIGH (ref <=2.51)

## 2013-04-18 NOTE — Procedures (Signed)
US guided biopsy of right axillary lymph node.  4 cores obtained.  No immediate complication.

## 2013-04-18 NOTE — Discharge Instructions (Signed)
Biopsy °Care After °Refer to this sheet in the next few weeks. These instructions provide you with information on caring for yourself after your procedure. Your caregiver may also give you more specific instructions. Your treatment has been planned according to current medical practices, but problems sometimes occur. Call your caregiver if you have any problems or questions after your procedure. °If you had a fine needle biopsy, you may have soreness at the biopsy site for 1 to 2 days. If you had an open biopsy, you may have soreness at the biopsy site for 3 to 4 days. °HOME CARE INSTRUCTIONS  °· You may resume normal diet and activities as directed. °· Change bandages (dressings) as directed. If your wound was closed with a skin glue (adhesive), it will wear off and begin to peel in 7 days. °· Only take over-the-counter or prescription medicines for pain, discomfort, or fever as directed by your caregiver. °· Ask your caregiver when you can bathe and get your wound wet. °SEEK IMMEDIATE MEDICAL CARE IF:  °· You have increased bleeding (more than a small spot) from the biopsy site. °· You notice redness, swelling, or increasing pain at the biopsy site. °· You have pus coming from the biopsy site. °· You have a fever. °· You notice a bad smell coming from the biopsy site or dressing. °· You have a rash, have difficulty breathing, or have any allergic problems. °MAKE SURE YOU:  °· Understand these instructions. °· Will watch your condition. °· Will get help right away if you are not doing well or get worse. °Document Released: 08/07/2004 Document Revised: 04/12/2011 Document Reviewed: 07/16/2010 °Exit[employer]® Patient Information ©2014 [employer]. ° °

## 2013-04-18 NOTE — Telephone Encounter (Signed)
Pt instructed to take 3 Hydrocodone/apap @ bedtime and to call us on Friday if he wasn't sleeping better per Dr. Barnet Glasgow. Patient said ok.

## 2013-04-23 ENCOUNTER — Ambulatory Visit (HOSPITAL_COMMUNITY): Payer: Medicare Other

## 2013-04-24 ENCOUNTER — Other Ambulatory Visit (HOSPITAL_COMMUNITY): Payer: Self-pay | Admitting: Oncology

## 2013-04-24 ENCOUNTER — Other Ambulatory Visit (HOSPITAL_COMMUNITY): Payer: Self-pay | Admitting: *Deleted

## 2013-04-24 ENCOUNTER — Other Ambulatory Visit (HOSPITAL_COMMUNITY): Payer: Self-pay | Admitting: Hematology and Oncology

## 2013-04-24 DIAGNOSIS — C8580 Other specified types of non-Hodgkin lymphoma, unspecified site: Secondary | ICD-10-CM

## 2013-04-24 DIAGNOSIS — C8330 Diffuse large B-cell lymphoma, unspecified site: Secondary | ICD-10-CM

## 2013-04-24 HISTORY — DX: Diffuse large B-cell lymphoma, unspecified site: C83.30

## 2013-04-24 MED ORDER — PREDNISONE 20 MG PO TABS: ORAL_TABLET | ORAL | Status: DC

## 2013-04-24 MED ORDER — ALLOPURINOL 300 MG PO TABS
300.0000 mg | ORAL_TABLET | Freq: Every day | ORAL | Status: DC
Start: 2013-04-24 — End: 2013-08-17

## 2013-04-24 MED ORDER — PROCHLORPERAZINE MALEATE 10 MG PO TABS: 10.0000 mg | ORAL_TABLET | Freq: Four times a day (QID) | ORAL | Status: AC | PRN

## 2013-04-24 MED ORDER — METOCLOPRAMIDE HCL 5 MG PO TABS: 5.0000 mg | ORAL_TABLET | Freq: Four times a day (QID) | ORAL | Status: AC | PRN

## 2013-04-24 MED ORDER — LIDOCAINE-PRILOCAINE 2.5-2.5 % EX CREA: TOPICAL_CREAM | CUTANEOUS | Status: AC

## 2013-04-24 NOTE — Patient Instructions (Addendum)
[facility]   CHEMOTHERAPY INSTRUCTIONS  Adriamycin - bone marrow suppression, nausea, vomiting, hair loss, mouth sores, cardiotoxicity (this is why we do the 2D Echoes or MUGA scans), sensitivity to light, will turn urine red for a few voids/urines after receiving it.  Cytoxan - can cause hemorrhagic cystitis (bloody urine) - this chemo irritates your bladder! We need you drinking 64 oz of fluid (preferably water/decaff fluids) 2 days prior to chemo and for up to 4-5 days after chemo. Drink more if you can. Do not hold your urine. Urinate before you go to bed and if you wake up in the middle of the night. This can also cause nausea/vomiting and hair loss.  Vincristine - peripheral neuropathy - numbness/tingling/burning in hands/fingers/feet/toes. Let us know if this develops. Hair loss, constipation, jaw pain  Rituxan - Before taking Rituxan you need to take Tylenol 681m and Benadryl 551m1 hour before the Rituxan. You can take this at home. This reduces your risk of having an allergic reaction to the Rituxan. You will do this each time prior to Rituxan. Side Effects: during infusion - itching, low blood pressure, low oxygen, bronchospasm, rash, trouble breathing - we need to know immediately if any of this happens. The first time you receive this drug, it takes a long time to infuse because we titrate the drug very slowly. With each Rituxan infusion, the likelihood of developing an infusion reaction decreases. You may also experience fever, chills, shaking chills, headaches, muscle aches, nausea, rash, and a low white blood cell count. We need to be sure that you are drinking plenty of fluids - preferably 64oz of decaff fluids/water daily. It is best to start drinking fluids 2 days prior to treatment and for up to 4-5 days after treatment. As your tumor breaks down, it leaves behind uric acid and the extra fluid that you drink helps to flush this out of your body. You  will also be on a medication called Allopurinol while taking Rituxan which help rid your body of the uric acid. It is important that you take this medication daily as prescribed.   Prednisone 2060mablet- you will take this on Days 1-5 of chemo. Take 4 tablets daily with food on days 1-5 of chemo. Refer to your calendar. This will likely make you hungrier than usual, make you have trouble sleeping, make you nervous/jittery.   Prior to your first chemo we will give you Rasburicase/Elitek. This will only be given one time. This is to protect your kidneys from the by products of the tumor cells dying. Uric acid results from the breakdown of the tumor cells and can accumulate in the kidneys. This medication helps get the uric acid out of your body so that it doesn't accumulate in the kidneys.   Other pre-meds prior to all chemo treatments: Dexamethasone - this is a steroid given to decrease the risk of you having an allergic reaction to the chemo that we give you. This steroid can make you feel flushed or look red in the face, make you jittery, nervous, or make it harder for you to sleep. Zofran - this is for nausea/vomiting. This is given to prevent/reduce nausea/vomiting. Tylenol & Benadryl will be given to prevent/reduce the risk of you having fever & chills as well as an allergic reaction to the Rituxan.    Day 2: Neulasta - this medication is not chemo but being given because you have had chemo. It is usually given 20-24  hours after the completion of chemotherapy. This medication works by boosting your bone marrow's supply of white blood cells. White blood cells are what protect our bodies against infection. The medication is given in the form of a subcutaneous injection. It is given in the fatty tissue of your abdomen. It is a short needle. The major side effect of this medication is bone or muscle pain. The drug of choice to relieve or lessen the pain is Aleve or Ibuprofen. If a physician has ever told  you not to take Aleve or Ibuprofen - then don't take it. You should then take Tylenol/acetaminophen. Take either medication as the bottle directs you to.  The level of pain you experience as a result of this injection can range from none, to mild or moderate, or severe. Please let us know if you develop moderate or severe bone pain.      POTENTIAL SIDE EFFECTS OF TREATMENT: Increased Susceptibility to Infection/Bone Marrow Suppression, Nausea/Vomiting, Constipation/Diarrhea/Abdominal Cramping, Red or Pink Urine (with Adriamycin), Hair Thinning/Hair Loss, Changes in Character of Skin and Nails (brittleness, dryness,etc.), Pigment Changes (darkening of nail beds, palms of hands, soles of feet, etc.), Blood in Urine, Painful Urination, Sun Sensitivity and Mouth Sores   SELF IMAGE NEEDS AND REFERRALS MADE: Obtain hair accessories as soon as possible (caps,etc.)   EDUCATIONAL MATERIALS GIVEN AND REVIEWED: Chemotherapy and You  Specific Instructions Sheets - Adriamycin, Cytoxan, Vincristine, Rituxan, Prednisone, Rasburicase, Neulasta, Dexamethasone, Zofran, Benadryl, Tylenol, Metoclopramide, Prochlorperazine, Allopurinol, EMLA cream, MUGA scans   SELF CARE ACTIVITIES WHILE ON CHEMOTHERAPY: Increase your fluid intake 48 hours prior to treatment and drink at least 2 quarts per day after treatment., No alcohol intake., No aspirin or other medications unless approved by your oncologist., Eat foods that are light and easy to digest., Eat foods at cold or room temperature., No fried, fatty, or spicy foods immediately before or after treatment., Have teeth cleaned professionally before starting treatment. Keep dentures and partial plates clean., Use soft toothbrush and do not use mouthwashes that contain alcohol. Biotene is a good mouthwash that is available at most pharmacies or may be ordered by calling [phone]., Use warm salt water gargles (1 teaspoon salt per 1 quart warm water) before and after  meals and at bedtime. Or you may rinse with 2 tablespoons of three -percent hydrogen peroxide mixed in eight ounces of water., Always use sunscreen with SPF (Sun Protection Factor) of 30 or higher., Use your nausea medication as directed to prevent nausea., Use your stool softener or laxative as directed to prevent constipation. and Use your anti-diarrheal medication as directed to stop diarrhea.  Please wash your hands for at least 30 seconds using warm soapy water. Handwashing is the #1 way to prevent the spread of germs. Stay away from sick people or people who are getting over a cold. If you develop respiratory systems such as green/yellow mucus production or productive cough or persistent cough let us know and we will see if you need an antibiotic. It is a good idea to keep a pair of gloves on when going into grocery stores/[employer] to decrease your risk of coming into contact with germs on the carts, etc. Carry alcohol hand gel with you at all times and use it frequently if out in public. All foods need to be cooked thoroughly. No raw foods. No medium or undercooked meats, eggs. If your food is cooked medium well, it does not need to be hot pink or saturated with bloody liquid at  all. Vegetables and fruits need to be washed/rinsed under the faucet with a dish detergent before being consumed. You can eat raw fruits and vegetables unless we tell you otherwise but it would be best if you cooked them or bought frozen. Do not eat off of salad bars or hot bars unless you really trust the cleanliness of the restaurant. If you need dental work, please let us know before you go for your appointment so that we can coordinate the best possible time for you in regards to your chemo regimen. You need to also let your dentist know that you are actively taking chemo. We may need to do labs prior to your dental appointment. We also want your bowels moving at least every other day. If this is not happening, we need to know so  that we can get you on a bowel regimen to help you go.      MEDICATIONS: You have been given prescriptions for the following medications:  EMLA cream. Apply a quarter size amount to port site 1 hour prior to chemo. Do not rub in. Cover with plastic wrap.  Metoclopramide/Reglan 11m tablet. May take 1 tablet four times a day if needed for nausea/vomiting.   Prochlorperazine/Compazine 161mtablet. May take 1 tablet four times a day if needed for nausea/vomiting.   Allopurinol 3006mablet. Take 1 tablet daily.   Prednisone 15m15mblet. On Days 1-5, take 4 tablets daily with a meal.     Over-the-Counter Meds:  Colace - this is a stool softener. Take 100mg50msule 2-6 times a day as needed. If you have to take more than 6 capsules of Colace a day call the [facility] - this is a mild laxative used to treat mild constipation. May take 2 tabs by mouth daily or up to twice a day as needed for mild constipation.  Milk of Magnesia - this is a laxative used to treat moderate to severe constipation. May take 2-4 tablespoons every 8 hours as needed. May increase to 8 tablespoons x 1 dose and if no bowel movement call the [facility] - this is for diarrhea. Take 2 tabs after 1st loose stool and then 1 tab after each loose stool until you go a total of 12 hours without a loose stool. Call [facility] st[facility] continue.   SYMPTOMS TO REPORT AS SOON AS POSSIBLE AFTER TREATMENT:  FEVER GREATER THAN 100.5 F  CHILLS WITH OR WITHOUT FEVER  NAUSEA AND VOMITING THAT IS NOT CONTROLLED WITH YOUR NAUSEA MEDICATION  UNUSUAL SHORTNESS OF BREATH  UNUSUAL BRUISING OR BLEEDING  TENDERNESS IN MOUTH AND THROAT WITH OR WITHOUT PRESENCE OF ULCERS  URINARY PROBLEMS  BOWEL PROBLEMS  UNUSUAL RASH    Wear comfortable clothing and clothing appropriate for easy access to any Portacath or PICC line. Let us [place] if there is anything that we can do to make your therapy  better!      I have been informed and understand all of the instructions given to me and have received a copy. I have been instructed to call the clinic [phone] family physician as soon as possible for continued medical care, if indicated. I do not have any more questions at this time but understand that I may call the [facility] Patient Nav[facility] at [phone] office hours should I have questions or need assistance in obtaining follow-up care.      _________________________________________  _______________     __________ Signature of Patient or Authorized Representative        Date                            Time      _________________________________________ Nurse's Signature      Cyclophosphamide injection What is this medicine? CYCLOPHOSPHAMIDE (sye kloe FOSS fa mide) is a chemotherapy drug. It slows the growth of cancer cells. This medicine is used to treat many types of cancer like lymphoma, myeloma, leukemia, breast cancer, and ovarian cancer, to name a few. This medicine may be used for other purposes; ask your health care provider or pharmacist if you have questions. COMMON BRAND NAME(S): Cytoxan, Neosar What should I tell my health care provider before I take this medicine? They need to know if you have any of these conditions: -blood disorders -history of other chemotherapy -infection -kidney disease -liver disease -recent or ongoing radiation therapy -tumors in the bone marrow -an unusual or allergic reaction to cyclophosphamide, other chemotherapy, other medicines, foods, dyes, or preservatives -pregnant or trying to get pregnant -breast-feeding How should I use this medicine? This drug is usually given as an injection into a vein or muscle or by infusion into a vein. It is administered in a hospital or clinic by a specially trained health care professional. Talk to your pediatrician regarding the use of this medicine in  children. Special care may be needed. Overdosage: If you think you have taken too much of this medicine contact a poison control center or emergency room at once. NOTE: This medicine is only for you. Do not share this medicine with others. What if I miss a dose? It is important not to miss your dose. Call your doctor or health care professional if you are unable to keep an appointment. What may interact with this medicine? This medicine may interact with the following medications: -amiodarone -amphotericin B -azathioprine -certain antiviral medicines for HIV or AIDS such as protease inhibitors (e.g., indinavir, ritonavir) and zidovudine -certain blood pressure medications such as benazepril, captopril, enalapril, fosinopril, lisinopril, moexipril, monopril, perindopril, quinapril, ramipril, trandolapril -certain cancer medications such as anthracyclines (e.g., daunorubicin, doxorubicin), busulfan, cytarabine, paclitaxel, pentostatin, tamoxifen, trastuzumab -certain diuretics such as chlorothiazide, chlorthalidone, hydrochlorothiazide, indapamide, metolazone -certain medicines that treat or prevent blood clots like warfarin -certain muscle relaxants such as succinylcholine -cyclosporine -etanercept -indomethacin -medicines to increase blood counts like filgrastim, pegfilgrastim, sargramostim -medicines used as general anesthesia -metronidazole -natalizumab This list may not describe all possible interactions. Give your health care provider a list of all the medicines, herbs, non-prescription drugs, or dietary supplements you use. Also tell them if you smoke, drink alcohol, or use illegal drugs. Some items may interact with your medicine. What should I watch for while using this medicine? Visit your doctor for checks on your progress. This drug may make you feel generally unwell. This is not uncommon, as chemotherapy can affect healthy cells as well as cancer cells. Report any side effects.  Continue your course of treatment even though you feel ill unless your doctor tells you to stop. Drink water or other fluids as directed. Urinate often, even at night. In some cases, you may be given additional medicines to help with side effects. Follow all directions for their use. Call your doctor or health care professional for advice if you get a fever, chills or sore throat, or other symptoms of a cold or flu. Do not treat  yourself. This drug decreases your body's ability to fight infections. Try to avoid being around people who are sick. This medicine may increase your risk to bruise or bleed. Call your doctor or health care professional if you notice any unusual bleeding. Be careful brushing and flossing your teeth or using a toothpick because you may get an infection or bleed more easily. If you have any dental work done, tell your dentist you are receiving this medicine. You may get drowsy or dizzy. Do not drive, use machinery, or do anything that needs mental alertness until you know how this medicine affects you. Do not become pregnant while taking this medicine or for 1 year after stopping it. Women should inform their doctor if they wish to become pregnant or think they might be pregnant. Men should not father a child while taking this medicine and for 4 months after stopping it. There is a potential for serious side effects to an unborn child. Talk to your health care professional or pharmacist for more information. Do not breast-feed an infant while taking this medicine. This medicine may interfere with the ability to have a child. This medicine has caused ovarian failure in some women. This medicine has caused reduced sperm counts in some men. You should talk with your doctor or health care professional if you are concerned about your fertility. If you are going to have surgery, tell your doctor or health care professional that you have taken this medicine. What side effects may I notice  from receiving this medicine? Side effects that you should report to your doctor or health care professional as soon as possible: -allergic reactions like skin rash, itching or hives, swelling of the face, lips, or tongue -low blood counts - this medicine may decrease the number of white blood cells, red blood cells and platelets. You may be at increased risk for infections and bleeding. -signs of infection - fever or chills, cough, sore throat, pain or difficulty passing urine -signs of decreased platelets or bleeding - bruising, pinpoint red spots on the skin, black, tarry stools, blood in the urine -signs of decreased red blood cells - unusually weak or tired, fainting spells, lightheadedness -breathing problems -dark urine -dizziness -palpitations -swelling of the ankles, feet, hands -trouble passing urine or change in the amount of urine -weight gain -yellowing of the eyes or skin Side effects that usually do not require medical attention (report to your doctor or health care professional if they continue or are bothersome): -changes in nail or skin color -hair loss -missed menstrual periods -mouth sores -nausea, vomiting This list may not describe all possible side effects. Call your doctor for medical advice about side effects. You may report side effects to FDA at 1-800-FDA-1088. Where should I keep my medicine? This drug is given in a hospital or clinic and will not be stored at home. NOTE: This sheet is a summary. It may not cover all possible information. If you have questions about this medicine, talk to your doctor, pharmacist, or health care provider.  2014, [employer]. (2011-12-03 16:22:58) Doxorubicin injection What is this medicine? DOXORUBICIN (dox oh ROO bi sin) is a chemotherapy drug. It is used to treat many kinds of cancer like Hodgkin's disease, leukemia, non-Hodgkin's lymphoma, neuroblastoma, sarcoma, and Wilms' tumor. It is also used to treat bladder  cancer, breast cancer, lung cancer, ovarian cancer, stomach cancer, and thyroid cancer. This medicine may be used for other purposes; ask your health care provider or pharmacist if you have  questions. COMMON BRAND NAME(S): Adriamycin PFS, Adriamycin RDF, Adriamycin, Rubex What should I tell my health care provider before I take this medicine? They need to know if you have any of these conditions: -blood disorders -heart disease, recent heart attack -infection (especially a virus infection such as chickenpox, cold sores, or herpes) -irregular heartbeat -liver disease -recent or ongoing radiation therapy -an unusual or allergic reaction to doxorubicin, other chemotherapy agents, other medicines, foods, dyes, or preservatives -pregnant or trying to get pregnant -breast-feeding How should I use this medicine? This drug is given as an infusion into a vein. It is administered in a hospital or clinic by a specially trained health care professional. If you have pain, swelling, burning or any unusual feeling around the site of your injection, tell your health care professional right away. Talk to your pediatrician regarding the use of this medicine in children. Special care may be needed. Overdosage: If you think you have taken too much of this medicine contact a poison control center or emergency room at once. NOTE: This medicine is only for you. Do not share this medicine with others. What if I miss a dose? It is important not to miss your dose. Call your doctor or health care professional if you are unable to keep an appointment. What may interact with this medicine? Do not take this medicine with any of the following medications: -cisapride -droperidol -halofantrine -pimozide -zidovudine This medicine may also interact with the following medications: -chloroquine -chlorpromazine -clarithromycin -cyclophosphamide -cyclosporine -erythromycin -medicines for depression, anxiety, or  psychotic disturbances -medicines for irregular heart beat like amiodarone, bepridil, dofetilide, encainide, flecainide, propafenone, quinidine -medicines for seizures like ethotoin, fosphenytoin, phenytoin -medicines for nausea, vomiting like dolasetron, ondansetron, palonosetron -medicines to increase blood counts like filgrastim, pegfilgrastim, sargramostim -methadone -methotrexate -pentamidine -progesterone -vaccines -verapamil Talk to your doctor or health care professional before taking any of these medicines: -acetaminophen -aspirin -ibuprofen -ketoprofen -naproxen This list may not describe all possible interactions. Give your health care provider a list of all the medicines, herbs, non-prescription drugs, or dietary supplements you use. Also tell them if you smoke, drink alcohol, or use illegal drugs. Some items may interact with your medicine. What should I watch for while using this medicine? Your condition will be monitored carefully while you are receiving this medicine. You will need important blood work done while you are taking this medicine. This drug may make you feel generally unwell. This is not uncommon, as chemotherapy can affect healthy cells as well as cancer cells. Report any side effects. Continue your course of treatment even though you feel ill unless your doctor tells you to stop. Your urine may turn red for a few days after your dose. This is not blood. If your urine is dark or brown, call your doctor. In some cases, you may be given additional medicines to help with side effects. Follow all directions for their use. Call your doctor or health care professional for advice if you get a fever, chills or sore throat, or other symptoms of a cold or flu. Do not treat yourself. This drug decreases your body's ability to fight infections. Try to avoid being around people who are sick. This medicine may increase your risk to bruise or bleed. Call your doctor or health  care professional if you notice any unusual bleeding. Be careful brushing and flossing your teeth or using a toothpick because you may get an infection or bleed more easily. If you have any dental work done, tell your  dentist you are receiving this medicine. Avoid taking products that contain aspirin, acetaminophen, ibuprofen, naproxen, or ketoprofen unless instructed by your doctor. These medicines may hide a fever. Men and women of childbearing age should use effective birth control methods while using taking this medicine. Do not become pregnant while taking this medicine. There is a potential for serious side effects to an unborn child. Talk to your health care professional or pharmacist for more information. Do not breast-feed an infant while taking this medicine. Do not let others touch your urine or other body fluids for 5 days after each treatment with this medicine. Caregivers should wear latex gloves to avoid touching body fluids during this time. There is a maximum amount of this medicine you should receive throughout your life. The amount depends on the medical condition being treated and your overall health. Your doctor will watch how much of this medicine you receive in your lifetime. Tell your doctor if you have taken this medicine before. What side effects may I notice from receiving this medicine? Side effects that you should report to your doctor or health care professional as soon as possible: -allergic reactions like skin rash, itching or hives, swelling of the face, lips, or tongue -low blood counts - this medicine may decrease the number of white blood cells, red blood cells and platelets. You may be at increased risk for infections and bleeding. -signs of infection - fever or chills, cough, sore throat, pain or difficulty passing urine -signs of decreased platelets or bleeding - bruising, pinpoint red spots on the skin, black, tarry stools, blood in the urine -signs of decreased  red blood cells - unusually weak or tired, fainting spells, lightheadedness -breathing problems -chest pain -fast, irregular heartbeat -mouth sores -nausea, vomiting -pain, swelling, redness at site where injected -pain, tingling, numbness in the hands or feet -swelling of ankles, feet, or hands -unusual bleeding or bruising Side effects that usually do not require medical attention (report to your doctor or health care professional if they continue or are bothersome): -diarrhea -facial flushing -hair loss -loss of appetite -missed menstrual periods -nail discoloration or damage -red or watery eyes -red colored urine -stomach upset This list may not describe all possible side effects. Call your doctor for medical advice about side effects. You may report side effects to FDA at 1-800-FDA-1088. Where should I keep my medicine? This drug is given in a hospital or clinic and will not be stored at home. NOTE: This sheet is a summary. It may not cover all possible information. If you have questions about this medicine, talk to your doctor, pharmacist, or health care provider.  2014, Elsevier/Gold Standard. (2012-05-16 09:54:34) Vincristine injection What is this medicine? VINCRISTINE (vin KRIS teen) is a chemotherapy drug. It slows the growth of cancer cells. This medicine is used to treat many types of cancer like Hodgkin's disease, leukemia, non-Hodgkin's lymphoma, neuroblastoma (brain cancer), rhabdomyosarcoma, and Wilms' tumor. This medicine may be used for other purposes; ask your health care provider or pharmacist if you have questions. COMMON BRAND NAME(S): Oncovin, Vincasar PFS What should I tell my health care provider before I take this medicine? They need to know if you have any of these conditions: -blood disorders -gout -infection (especially chickenpox, cold sores, or herpes) -kidney disease -liver disease -lung disease -nervous system disease like Charcot-Marie-Tooth  (CMT) -recent or ongoing radiation therapy -an unusual or allergic reaction to vincristine, other chemotherapy agents, other medicines, foods, dyes, or preservatives -pregnant or trying to get  pregnant -breast-feeding How should I use this medicine? This drug is given as an infusion into a vein. It is administered in a hospital or clinic by a specially trained health care professional. If you have pain, swelling, burning, or any unusual feeling around the site of your injection, tell your health care professional right away. Talk to your pediatrician regarding the use of this medicine in children. While this drug may be prescribed for selected conditions, precautions do apply. Overdosage: If you think you have taken too much of this medicine contact a poison control center or emergency room at once. NOTE: This medicine is only for you. Do not share this medicine with others. What if I miss a dose? It is important not to miss your dose. Call your doctor or health care professional if you are unable to keep an appointment. What may interact with this medicine? Do not take this medicine with any of the following medications: -itraconazole -mibefradil -voriconazole This medicine may also interact with the following medications: -cyclosporine -erythromycin -fluconazole -ketoconazole -medicines for HIV like delavirdine, efavirenz, nevirapine -medicines for seizures like ethotoin, fosphenotoin, phenytoin -medicines to increase blood counts like filgrastim, pegfilgrastim, sargramostim -other chemotherapy drugs like cisplatin, L-asparaginase, methotrexate, mitomycin, paclitaxel -pegaspargase -vaccines -zalcitabine, ddC Talk to your doctor or health care professional before taking any of these medicines: -acetaminophen -aspirin -ibuprofen -ketoprofen -naproxen This list may not describe all possible interactions. Give your health care provider a list of all the medicines, herbs,  non-prescription drugs, or dietary supplements you use. Also tell them if you smoke, drink alcohol, or use illegal drugs. Some items may interact with your medicine. What should I watch for while using this medicine? Your condition will be monitored carefully while you are receiving this medicine. You will need important blood work done while you are taking this medicine. This drug may make you feel generally unwell. This is not uncommon, as chemotherapy can affect healthy cells as well as cancer cells. Report any side effects. Continue your course of treatment even though you feel ill unless your doctor tells you to stop. In some cases, you may be given additional medicines to help with side effects. Follow all directions for their use. Call your doctor or health care professional for advice if you get a fever, chills or sore throat, or other symptoms of a cold or flu. Do not treat yourself. Avoid taking products that contain aspirin, acetaminophen, ibuprofen, naproxen, or ketoprofen unless instructed by your doctor. These medicines may hide a fever. Do not become pregnant while taking this medicine. Women should inform their doctor if they wish to become pregnant or think they might be pregnant. There is a potential for serious side effects to an unborn child. Talk to your health care professional or pharmacist for more information. Do not breast-feed an infant while taking this medicine. Men may have a lower sperm count while taking this medicine. Talk to your doctor if you plan to father a child. What side effects may I notice from receiving this medicine? Side effects that you should report to your doctor or health care professional as soon as possible: -allergic reactions like skin rash, itching or hives, swelling of the face, lips, or tongue -breathing problems -confusion or changes in emotions or moods -constipation -cough -mouth sores -muscle weakness -nausea and vomiting -pain, swelling,  redness or irritation at the injection site -pain, tingling, numbness in the hands or feet -problems with balance, talking, walking -seizures -stomach pain -trouble passing urine or change  in the amount of urine Side effects that usually do not require medical attention (report to your doctor or health care professional if they continue or are bothersome): -diarrhea -hair loss -jaw pain -loss of appetite This list may not describe all possible side effects. Call your doctor for medical advice about side effects. You may report side effects to FDA at 1-800-FDA-1088. Where should I keep my medicine? This drug is given in a hospital or clinic and will not be stored at home. NOTE: This sheet is a summary. It may not cover all possible information. If you have questions about this medicine, talk to your doctor, pharmacist, or health care provider.  2014, [employer]/Gold Standard. (2007-10-16 17:17:13) Rituximab injection What is this medicine? RITUXIMAB (ri TUX i mab) is a monoclonal antibody. This medicine changes the way the body's immune system works. It is used commonly to treat non-Hodgkin's lymphoma and other conditions. In cancer cells, this drug targets a specific protein within cancer cells and stops the cancer cells from growing. It is also used to treat rhuematoid arthritis (RA). In RA, this medicine slow the inflammatory process and help reduce joint pain and swelling. This medicine is often used with other cancer or arthritis medications. This medicine may be used for other purposes; ask your health care provider or pharmacist if you have questions. COMMON BRAND NAME(S): Rituxan What should I tell my health care provider before I take this medicine? They need to know if you have any of these conditions: -blood disorders -heart disease -history of hepatitis B -infection (especially a virus infection such as chickenpox, cold sores, or herpes) -irregular heartbeat -kidney  disease -lung or breathing disease, like asthma -lupus -an unusual or allergic reaction to rituximab, mouse proteins, other medicines, foods, dyes, or preservatives -pregnant or trying to get pregnant -breast-feeding How should I use this medicine? This medicine is for infusion into a vein. It is administered in a hospital or clinic by a specially trained health care professional. A special MedGuide will be given to you by the pharmacist with each prescription and refill. Be sure to read this information carefully each time. Talk to your pediatrician regarding the use of this medicine in children. This medicine is not approved for use in children. Overdosage: If you think you have taken too much of this medicine contact a poison control center or emergency room at once. NOTE: This medicine is only for you. Do not share this medicine with others. What if I miss a dose? It is important not to miss a dose. Call your doctor or health care professional if you are unable to keep an appointment. What may interact with this medicine? -cisplatin -medicines for blood pressure -some other medicines for arthritis -vaccines This list may not describe all possible interactions. Give your health care provider a list of all the medicines, herbs, non-prescription drugs, or dietary supplements you use. Also tell them if you smoke, drink alcohol, or use illegal drugs. Some items may interact with your medicine. What should I watch for while using this medicine? Report any side effects that you notice during your treatment right away, such as changes in your breathing, fever, chills, dizziness or lightheadedness. These effects are more common with the first dose. Visit your prescriber or health care professional for checks on your progress. You will need to have regular blood work. Report any other side effects. The side effects of this medicine can continue after you finish your treatment. Continue your course of  treatment even  though you feel ill unless your doctor tells you to stop. Call your doctor or health care professional for advice if you get a fever, chills or sore throat, or other symptoms of a cold or flu. Do not treat yourself. This drug decreases your body's ability to fight infections. Try to avoid being around people who are sick. This medicine may increase your risk to bruise or bleed. Call your doctor or health care professional if you notice any unusual bleeding. Be careful brushing and flossing your teeth or using a toothpick because you may get an infection or bleed more easily. If you have any dental work done, tell your dentist you are receiving this medicine. Avoid taking products that contain aspirin, acetaminophen, ibuprofen, naproxen, or ketoprofen unless instructed by your doctor. These medicines may hide a fever. Do not become pregnant while taking this medicine. Women should inform their doctor if they wish to become pregnant or think they might be pregnant. There is a potential for serious side effects to an unborn child. Talk to your health care professional or pharmacist for more information. Do not breast-feed an infant while taking this medicine. What side effects may I notice from receiving this medicine? Side effects that you should report to your doctor or health care professional as soon as possible: -allergic reactions like skin rash, itching or hives, swelling of the face, lips, or tongue -low blood counts - this medicine may decrease the number of white blood cells, red blood cells and platelets. You may be at increased risk for infections and bleeding. -signs of infection - fever or chills, cough, sore throat, pain or difficulty passing urine -signs of decreased platelets or bleeding - bruising, pinpoint red spots on the skin, black, tarry stools, blood in the urine -signs of decreased red blood cells - unusually weak or tired, fainting spells, lightheadedness -breathing  problems -confused, not responsive -chest pain -fast, irregular heartbeat -feeling faint or lightheaded, falls -mouth sores -redness, blistering, peeling or loosening of the skin, including inside the mouth -stomach pain -swelling of the ankles, feet, or hands -trouble passing urine or change in the amount of urine Side effects that usually do not require medical attention (report to your doctor or other health care professional if they continue or are bothersome): -anxiety -headache -loss of appetite -muscle aches -nausea -night sweats This list may not describe all possible side effects. Call your doctor for medical advice about side effects. You may report side effects to FDA at 1-800-FDA-1088. Where should I keep my medicine? This drug is given in a hospital or clinic and will not be stored at home. NOTE: This sheet is a summary. It may not cover all possible information. If you have questions about this medicine, talk to your doctor, pharmacist, or health care provider.  2014, Elsevier/Gold Standard. (2007-09-18 14:04:59) Rasburicase Injection What is this medicine? RASBURICASE (ras BURE i kase) breaks down uric acid in the blood. It is used to prevent and to treat high levels of uric acid caused by cancer treatment. This medicine may be used for other purposes; ask your health care provider or pharmacist if you have questions. COMMON BRAND NAME(S): Elitek What should I tell my health care provider before I take this medicine? They need to know if you have any of these conditions: -G6PD deficiency -history of anemia -history of blood transfusion -an unusual or allergic reaction to rasburicase, yeast, mannitol, other medicines, foods, dyes, or preservatives -pregnant or trying to get pregnant -breast-feeding How should  I use this medicine? This medicine is for infusion into a vein. It is given by a health care professional in a hospital or clinic setting. Talk to your  pediatrician regarding the use of this medicine in children. While this drug may be prescribed for children as young as 93 month old for selected conditions, precautions do apply. Overdosage: If you think you have taken too much of this medicine contact a poison control center or emergency room at once. NOTE: This medicine is only for you. Do not share this medicine with others. What if I miss a dose? This does not apply. What may interact with this medicine? -allopurinol This list may not describe all possible interactions. Give your health care provider a list of all the medicines, herbs, non-prescription drugs, or dietary supplements you use. Also tell them if you smoke, drink alcohol, or use illegal drugs. Some items may interact with your medicine. What should I watch for while using this medicine? Your condition will be monitored carefully while you are receiving this medicine. You will need to have regular blood tests during your treatment. What side effects may I notice from receiving this medicine? Side effects that you should report to your doctor or health care professional as soon as possible: -allergic reactions like skin rash, itching or hives, swelling of the face, lips, or tongue -blue color to lips or nailbeds -breathing problems -chest pain, tightness -fast, irregular heartbeat -feeling faint or lightheaded, falls -fever -low blood pressure -seizures -trouble passing urine or change in the amount of urine -yellowing of the eyes or skin Side effects that usually do not require medical attention (report to your doctor or health care professional if they continue or are bothersome): -constipation or diarrhea -diarrhea -headache -mouth sores -nausea, vomiting This list may not describe all possible side effects. Call your doctor for medical advice about side effects. You may report side effects to FDA at 1-800-FDA-1088. Where should I keep my medicine? This drug is given  in a hospital or clinic and will not be stored at home. NOTE: This sheet is a summary. It may not cover all possible information. If you have questions about this medicine, talk to your doctor, pharmacist, or health care provider.  2014, [employer] Standard. (2007-09-18 14:22:08) Pegfilgrastim injection What is this medicine? PEGFILGRASTIM (peg fil GRA stim) helps the body make more white blood cells. It is used to prevent infection in people with low amounts of white blood cells following cancer treatment. This medicine may be used for other purposes; ask your health care provider or pharmacist if you have questions. COMMON BRAND NAME(S): Neulasta What should I tell my health care provider before I take this medicine? They need to know if you have any of these conditions: -sickle cell disease -an unusual or allergic reaction to pegfilgrastim, filgrastim, E.coli protein, other medicines, foods, dyes, or preservatives -pregnant or trying to get pregnant -breast-feeding How should I use this medicine? This medicine is for injection under the skin. It is usually given by a health care professional in a hospital or clinic setting. If you get this medicine at home, you will be taught how to prepare and give this medicine. Do not shake this medicine. Use exactly as directed. Take your medicine at regular intervals. Do not take your medicine more often than directed. It is important that you put your used needles and syringes in a special sharps container. Do not put them in a trash can. If you do not have a  sharps container, call your pharmacist or healthcare provider to get one. Talk to your pediatrician regarding the use of this medicine in children. While this drug may be prescribed for children who weigh more than 45 kg for selected conditions, precautions do apply Overdosage: If you think you have taken too much of this medicine contact a poison control center or emergency room at once. NOTE:  This medicine is only for you. Do not share this medicine with others. What if I miss a dose? If you miss a dose, take it as soon as you can. If it is almost time for your next dose, take only that dose. Do not take double or extra doses. What may interact with this medicine? -lithium -medicines for growth therapy This list may not describe all possible interactions. Give your health care provider a list of all the medicines, herbs, non-prescription drugs, or dietary supplements you use. Also tell them if you smoke, drink alcohol, or use illegal drugs. Some items may interact with your medicine. What should I watch for while using this medicine? Visit your doctor for regular check ups. You will need important blood work done while you are taking this medicine. What side effects may I notice from receiving this medicine? Side effects that you should report to your doctor or health care professional as soon as possible: -allergic reactions like skin rash, itching or hives, swelling of the face, lips, or tongue -breathing problems -fever -pain, redness, or swelling where injected -shoulder pain -stomach or side pain Side effects that usually do not require medical attention (report to your doctor or health care professional if they continue or are bothersome): -aches, pains -headache -loss of appetite -nausea, vomiting -unusually tired This list may not describe all possible side effects. Call your doctor for medical advice about side effects. You may report side effects to FDA at 1-800-FDA-1088. Where should I keep my medicine? Keep out of the reach of children. Store in a refrigerator between 2 and 8 degrees C (36 and 46 degrees F). Do not freeze. Keep in carton to protect from light. Throw away this medicine if it is left out of the refrigerator for more than 48 hours. Throw away any unused medicine after the expiration date. NOTE: This sheet is a summary. It may not cover all possible  information. If you have questions about this medicine, talk to your doctor, pharmacist, or health care provider.  2014, [employer] Standard. (2007-08-21 15:41:44) Allopurinol tablets What is this medicine? ALLOPURINOL (al oh PURE i nole) reduces the amount of uric acid the body makes. It is used to treat the symptoms of gout. It is also used to treat or prevent high uric acid levels that occur as a result of certain types of chemotherapy. This medicine may also help patients who frequently have kidney stones. This medicine may be used for other purposes; ask your health care provider or pharmacist if you have questions. COMMON BRAND NAME(S): Zyloprim What should I tell my health care provider before I take this medicine? They need to know if you have any of these conditions: -kidney or liver disease -an unusual or allergic reaction to allopurinol, other medicines, foods, dyes, or preservatives -pregnant or trying to get pregnant -breast feeding How should I use this medicine? Take this medicine by mouth with a glass of water. Follow the directions on the prescription label. If this medicine upsets your stomach, take it with food or milk. Take your doses at regular intervals. Do not take  your medicine more often than directed. Talk to your pediatrician regarding the use of this medicine in children. Special care may be needed. While this drug may be prescribed for children as young as 6 years for selected conditions, precautions do apply. Overdosage: If you think you have taken too much of this medicine contact a poison control center or emergency room at once. NOTE: This medicine is only for you. Do not share this medicine with others. What if I miss a dose? If you miss a dose, take it as soon as you can. If it is almost time for your next dose, take only that dose. Do not take double or extra doses. What may interact with this medicine? Do not take this medicine with the following  medication: -didanosine, ddI This medicine may also interact with the following medications: -amoxicillin or ampicillin -azathioprine -certain medicines used to treat gout -certain types of diuretics -chlorpropamide -cyclosporine -dicumarol -mercaptopurine -tolbutamide -warfarin This list may not describe all possible interactions. Give your health care provider a list of all the medicines, herbs, non-prescription drugs, or dietary supplements you use. Also tell them if you smoke, drink alcohol, or use illegal drugs. Some items may interact with your medicine. What should I watch for while using this medicine? Visit your doctor or health care professional for regular checks on your progress. If you are taking this medicine to treat gout, you may not have less frequent attacks at first. Keep taking your medicine regularly and the attacks should get better within 2 to 6 weeks. Drink plenty of water (10 to 12 full glasses a day) while you are taking this medicine. This will help to reduce stomach upset and reduce the risk of getting gout or kidney stones. Call your doctor or health care professional at once if you get a skin rash together with chills, fever, sore throat, or nausea and vomiting, if you have blood in your urine, or difficulty passing urine. Do not take vitamin C without asking your doctor or health care professional. Too much vitamin C can increase the chance of getting kidney stones. You may get drowsy or dizzy. Do not drive, use machinery, or do anything that needs mental alertness until you know how this drug affects you. Do not stand or sit up quickly, especially if you are an older patient. This reduces the risk of dizzy or fainting spells. Alcohol can make you more drowsy and dizzy. Alcohol can also increase the chance of stomach problems and increase the amount of uric acid in your blood. Avoid alcoholic drinks. What side effects may I notice from receiving this medicine? Side  effects that you should report to your doctor or health care professional as soon as possible: -allergic reactions like skin rash, itching or hives, swelling of the face, lips, or tongue -breathing problems -muscle aches or pains -redness, blistering, peeling or loosening of the skin, including inside the mouth Side effects that usually do not require medical attention (report to your doctor or health care professional if they continue or are bothersome): -changes in taste -diarrhea -indigestion -stomach pain or cramps This list may not describe all possible side effects. Call your doctor for medical advice about side effects. You may report side effects to FDA at 1-800-FDA-1088. Where should I keep my medicine? Keep out of the reach of children. Store at room temperature between 15 and 25 degrees C (59 and 77 degrees F). Protect from light and moisture. Throw away any unused medicine after the expiration  date. NOTE: This sheet is a summary. It may not cover all possible information. If you have questions about this medicine, talk to your doctor, pharmacist, or health care provider.  2014, [employer] Standard. (2007-07-24 14:26:54) Prednisone tablets What is this medicine? PREDNISONE (PRED ni sone) is a corticosteroid. It is commonly used to treat inflammation of the skin, joints, lungs, and other organs. Common conditions treated include asthma, allergies, and arthritis. It is also used for other conditions, such as blood disorders and diseases of the adrenal glands. This medicine may be used for other purposes; ask your health care provider or pharmacist if you have questions. COMMON BRAND NAME(S): Deltasone, Predone, Sterapred DS, Sterapred What should I tell my health care provider before I take this medicine? They need to know if you have any of these conditions: -Cushing's syndrome -diabetes -glaucoma -heart disease -high blood pressure -infection (especially a virus infection  such as chickenpox, cold sores, or herpes) -kidney disease -liver disease -mental illness -myasthenia gravis -osteoporosis -seizures -stomach or intestine problems -thyroid disease -an unusual or allergic reaction to lactose, prednisone, other medicines, foods, dyes, or preservatives -pregnant or trying to get pregnant -breast-feeding How should I use this medicine? Take this medicine by mouth with a glass of water. Follow the directions on the prescription label. Take this medicine with food. If you are taking this medicine once a day, take it in the morning. Do not take more medicine than you are told to take. Do not suddenly stop taking your medicine because you may develop a severe reaction. Your doctor will tell you how much medicine to take. If your doctor wants you to stop the medicine, the dose may be slowly lowered over time to avoid any side effects. Talk to your pediatrician regarding the use of this medicine in children. Special care may be needed. Overdosage: If you think you have taken too much of this medicine contact a poison control center or emergency room at once. NOTE: This medicine is only for you. Do not share this medicine with others. What if I miss a dose? If you miss a dose, take it as soon as you can. If it is almost time for your next dose, talk to your doctor or health care professional. You may need to miss a dose or take an extra dose. Do not take double or extra doses without advice. What may interact with this medicine? Do not take this medicine with any of the following medications: -metyrapone -mifepristone This medicine may also interact with the following medications: -aminoglutethimide -amphotericin B -aspirin and aspirin-like medicines -barbiturates -certain medicines for diabetes, like glipizide or glyburide -cholestyramine -cholinesterase inhibitors -cyclosporine -digoxin -diuretics -ephedrine -male hormones, like estrogens and birth  control pills -isoniazid -ketoconazole -NSAIDS, medicines for pain and inflammation, like ibuprofen or naproxen -phenytoin -rifampin -toxoids -vaccines -warfarin This list may not describe all possible interactions. Give your health care provider a list of all the medicines, herbs, non-prescription drugs, or dietary supplements you use. Also tell them if you smoke, drink alcohol, or use illegal drugs. Some items may interact with your medicine. What should I watch for while using this medicine? Visit your doctor or health care professional for regular checks on your progress. If you are taking this medicine over a prolonged period, carry an identification card with your name and address, the type and dose of your medicine, and your doctor's name and address. This medicine may increase your risk of getting an infection. Tell your doctor or health care  professional if you are around anyone with measles or chickenpox, or if you develop sores or blisters that do not heal properly. If you are going to have surgery, tell your doctor or health care professional that you have taken this medicine within the last twelve months. Ask your doctor or health care professional about your diet. You may need to lower the amount of salt you eat. This medicine may affect blood sugar levels. If you have diabetes, check with your doctor or health care professional before you change your diet or the dose of your diabetic medicine. What side effects may I notice from receiving this medicine? Side effects that you should report to your doctor or health care professional as soon as possible: -allergic reactions like skin rash, itching or hives, swelling of the face, lips, or tongue -changes in emotions or moods -changes in vision -depressed mood -eye pain -fever or chills, cough, sore throat, pain or difficulty passing urine -increased thirst -swelling of ankles, feet Side effects that usually do not require medical  attention (report to your doctor or health care professional if they continue or are bothersome): -confusion, excitement, restlessness -headache -nausea, vomiting -skin problems, acne, thin and shiny skin -trouble sleeping -weight gain This list may not describe all possible side effects. Call your doctor for medical advice about side effects. You may report side effects to FDA at 1-800-FDA-1088. Where should I keep my medicine? Keep out of the reach of children. Store at room temperature between 15 and 30 degrees C (59 and 86 degrees F). Protect from light. Keep container tightly closed. Throw away any unused medicine after the expiration date. NOTE: This sheet is a summary. It may not cover all possible information. If you have questions about this medicine, talk to your doctor, pharmacist, or health care provider.  2014, [employer] Standard. (2010-09-03 10:57:14) Metoclopramide tablets What is this medicine? METOCLOPRAMIDE (met oh kloe PRA mide) is used to treat the symptoms of gastroesophageal reflux disease (GERD) like heartburn. It is also used to treat people with slow emptying of the stomach and intestinal tract. This medicine may be used for other purposes; ask your health care provider or pharmacist if you have questions. COMMON BRAND NAME(S): Reglan What should I tell my health care provider before I take this medicine? They need to know if you have any of these conditions: -breast cancer -depression -diabetes -heart failure -high blood pressure -kidney disease -liver disease -Parkinson's disease or a movement disorder -pheochromocytoma -seizures -stomach obstruction, bleeding, or perforation -an unusual or allergic reaction to metoclopramide, procainamide, sulfites, other medicines, foods, dyes, or preservatives -pregnant or trying to get pregnant -breast-feeding How should I use this medicine? Take this medicine by mouth with a glass of water. Follow the directions  on the prescription label. Take this medicine on an empty stomach, about 30 minutes before eating. Take your doses at regular intervals. Do not take your medicine more often than directed. Do not stop taking except on the advice of your doctor or health care professional. A special MedGuide will be given to you by the pharmacist with each prescription and refill. Be sure to read this information carefully each time. Talk to your pediatrician regarding the use of this medicine in children. Special care may be needed. Overdosage: If you think you have taken too much of this medicine contact a poison control center or emergency room at once. NOTE: This medicine is only for you. Do not share this medicine with others. What if I  miss a dose? If you miss a dose, take it as soon as you can. If it is almost time for your next dose, take only that dose. Do not take double or extra doses. What may interact with this medicine? -acetaminophen -cyclosporine -digoxin -medicines for blood pressure -medicines for diabetes, including insulin -medicines for hay fever and other allergies -medicines for depression, especially an Monoamine Oxidase Inhibitor (MAOI) -medicines for Parkinson's disease, like levodopa -medicines for sleep or for pain -tetracycline This list may not describe all possible interactions. Give your health care provider a list of all the medicines, herbs, non-prescription drugs, or dietary supplements you use. Also tell them if you smoke, drink alcohol, or use illegal drugs. Some items may interact with your medicine. What should I watch for while using this medicine? It may take a few weeks for your stomach condition to start to get better. However, do not take this medicine for longer than 12 weeks. The longer you take this medicine, and the more you take it, the greater your chances are of developing serious side effects. If you are an elderly patient, a male patient, or you have diabetes,  you may be at an increased risk for side effects from this medicine. Contact your doctor immediately if you start having movements you cannot control such as lip smacking, rapid movements of the tongue, involuntary or uncontrollable movements of the eyes, head, arms and legs, or muscle twitches and spasms. Patients and their families should watch out for worsening depression or thoughts of suicide. Also watch out for any sudden or severe changes in feelings such as feeling anxious, agitated, panicky, irritable, hostile, aggressive, impulsive, severely restless, overly excited and hyperactive, or not being able to sleep. If this happens, especially at the beginning of treatment or after a change in dose, call your doctor. Do not treat yourself for high fever. Ask your doctor or health care professional for advice. You may get drowsy or dizzy. Do not drive, use machinery, or do anything that needs mental alertness until you know how this drug affects you. Do not stand or sit up quickly, especially if you are an older patient. This reduces the risk of dizzy or fainting spells. Alcohol can make you more drowsy and dizzy. Avoid alcoholic drinks. What side effects may I notice from receiving this medicine? Side effects that you should report to your doctor or health care professional as soon as possible: -allergic reactions like skin rash, itching or hives, swelling of the face, lips, or tongue -abnormal production of milk in females -breast enlargement in both males and females -change in the way you walk -difficulty moving, speaking or swallowing -drooling, lip smacking, or rapid movements of the tongue -excessive sweating -fever -involuntary or uncontrollable movements of the eyes, head, arms and legs -irregular heartbeat or palpitations -muscle twitches and spasms -unusually weak or tired Side effects that usually do not require medical attention (report to your doctor or health care professional if  they continue or are bothersome): -change in sex drive or performance -depressed mood -diarrhea -difficulty sleeping -headache -menstrual changes -restless or nervous This list may not describe all possible side effects. Call your doctor for medical advice about side effects. You may report side effects to FDA at 1-800-FDA-1088. Where should I keep my medicine? Keep out of the reach of children. Store at room temperature between 20 and 25 degrees C (68 and 77 degrees F). Protect from light. Keep container tightly closed. Throw away any unused medicine after  the expiration date. NOTE: This sheet is a summary. It may not cover all possible information. If you have questions about this medicine, talk to your doctor, pharmacist, or health care provider.  2014, [employer] Standard. (2011-05-18 13:04:38) Prochlorperazine tablets What is this medicine? PROCHLORPERAZINE (proe klor PER a zeen) helps to control severe nausea and vomiting. This medicine is also used to treat schizophrenia. It can also help patients who experience anxiety that is not due to psychological illness. This medicine may be used for other purposes; ask your health care provider or pharmacist if you have questions. COMMON BRAND NAME(S): Compazine What should I tell my health care provider before I take this medicine? They need to know if you have any of these conditions: -blood disorders or disease -dementia -liver disease or jaundice -Parkinson's disease -uncontrollable movement disorder -an unusual or allergic reaction to prochlorperazine, other medicines, foods, dyes, or preservatives -pregnant or trying to get pregnant -breast-feeding How should I use this medicine? Take this medicine by mouth with a glass of water. Follow the directions on the prescription label. Take your doses at regular intervals. Do not take your medicine more often than directed. Do not stop taking this medicine suddenly. This can cause  nausea, vomiting, and dizziness. Ask your doctor or health care professional for advice. Talk to your pediatrician regarding the use of this medicine in children. Special care may be needed. While this drug may be prescribed for children as young as 2 years for selected conditions, precautions do apply. Overdosage: If you think you have taken too much of this medicine contact a poison control center or emergency room at once. NOTE: This medicine is only for you. Do not share this medicine with others. What if I miss a dose? If you miss a dose, take it as soon as you can. If it is almost time for your next dose, take only that dose. Do not take double or extra doses. What may interact with this medicine? Do not take this medicine with any of the following medications: -amoxapine -antidepressants like citalopram, escitalopram, fluoxetine, paroxetine, and sertraline -deferoxamine -dofetilide -maprotiline -tricyclic antidepressants like amitriptyline, clomipramine, imipramine, nortiptyline and others This medicine may also interact with the following medications: -lithium -medicines for pain -phenytoin -propranolol -warfarin This list may not describe all possible interactions. Give your health care provider a list of all the medicines, herbs, non-prescription drugs, or dietary supplements you use. Also tell them if you smoke, drink alcohol, or use illegal drugs. Some items may interact with your medicine. What should I watch for while using this medicine? Visit your doctor or health care professional for regular checks on your progress. You may get drowsy or dizzy. Do not drive, use machinery, or do anything that needs mental alertness until you know how this medicine affects you. Do not stand or sit up quickly, especially if you are an older patient. This reduces the risk of dizzy or fainting spells. Alcohol may interfere with the effect of this medicine. Avoid alcoholic drinks. This medicine  can reduce the response of your body to heat or cold. Dress warm in cold weather and stay hydrated in hot weather. If possible, avoid extreme temperatures like saunas, hot tubs, very hot or cold showers, or activities that can cause dehydration such as vigorous exercise. This medicine can make you more sensitive to the sun. Keep out of the sun. If you cannot avoid being in the sun, wear protective clothing and use sunscreen. Do not use sun lamps or  tanning beds/booths. Your mouth may get dry. Chewing sugarless gum or sucking hard candy, and drinking plenty of water may help. Contact your doctor if the problem does not go away or is severe. What side effects may I notice from receiving this medicine? Side effects that you should report to your doctor or health care professional as soon as possible: -blurred vision -breast enlargement in men or women -breast milk in women who are not breast-feeding -chest pain, fast or irregular heartbeat -confusion, restlessness -dark yellow or brown urine -difficulty breathing or swallowing -dizziness or fainting spells -drooling, shaking, movement difficulty (shuffling walk) or rigidity -fever, chills, sore throat -involuntary or uncontrollable movements of the eyes, mouth, head, arms, and legs -seizures -stomach area pain -unusually weak or tired -unusual bleeding or bruising -yellowing of skin or eyes Side effects that usually do not require medical attention (report to your doctor or health care professional if they continue or are bothersome): -difficulty passing urine -difficulty sleeping -headache -sexual dysfunction -skin rash, or itching This list may not describe all possible side effects. Call your doctor for medical advice about side effects. You may report side effects to FDA at 1-800-FDA-1088. Where should I keep my medicine? Keep out of the reach of children. Store at room temperature between 15 and 30 degrees C (59 and 86 degrees F).  Protect from light. Throw away any unused medicine after the expiration date. NOTE: This sheet is a summary. It may not cover all possible information. If you have questions about this medicine, talk to your doctor, pharmacist, or health care provider.  2014, [employer] Standard. (2011-06-08 16:59:39) Acetaminophen tablets or caplets What is this medicine? ACETAMINOPHEN (a set a MEE noe fen) is a pain reliever. It is used to treat mild pain and fever. This medicine may be used for other purposes; ask your health care provider or pharmacist if you have questions. COMMON BRAND NAME(S): Aceta, Actamin, Anacin Aspirin Free, Genapap, Genebs, Mapap, Pain & Fever , Pain and Fever , PAIN RELIEF , PAIN RELIEF Extra Strength, Pain Reliever, Panadol, Q-Pap Extra Strength, Q-Pap, Tylenol CrushableTablet, Tylenol Extra Strength, Tylenol, XS No Aspirin, XS Pain Reliever What should I tell my health care provider before I take this medicine? They need to know if you have any of these conditions: -if you often drink alcohol -liver disease -an unusual or allergic reaction to acetaminophen, other medicines, foods, dyes, or preservatives -pregnant or trying to get pregnant -breast-feeding How should I use this medicine? Take this medicine by mouth with a glass of water. Follow the directions on the package or prescription label. Take your medicine at regular intervals. Do not take your medicine more often than directed. Talk to your pediatrician regarding the use of this medicine in children. While this drug may be prescribed for children as young as 33 years of age for selected conditions, precautions do apply. Overdosage: If you think you have taken too much of this medicine contact a poison control center or emergency room at once. NOTE: This medicine is only for you. Do not share this medicine with others. What if I miss a dose? If you miss a dose, take it as soon as you can. If it is almost time for your  next dose, take only that dose. Do not take double or extra doses. What may interact with this medicine? -alcohol -imatinib -isoniazid -other medicines with acetaminophen This list may not describe all possible interactions. Give your health care provider a list of all the medicines,  herbs, non-prescription drugs, or dietary supplements you use. Also tell them if you smoke, drink alcohol, or use illegal drugs. Some items may interact with your medicine. What should I watch for while using this medicine? Tell your doctor or health care professional if the pain lasts more than 10 days (5 days for children), if it gets worse, or if there is a new or different kind of pain. Also, check with your doctor if a fever lasts for more than 3 days. Do not take other medicines that contain acetaminophen with this medicine. Always read labels carefully. If you have questions, ask your doctor or pharmacist. If you take too much acetaminophen get medical help right away. Too much acetaminophen can be very dangerous and cause liver damage. Even if you do not have symptoms, it is important to get help right away. What side effects may I notice from receiving this medicine? Side effects that you should report to your doctor or health care professional as soon as possible: -allergic reactions like skin rash, itching or hives, swelling of the face, lips, or tongue -breathing problems -fever or sore throat -redness, blistering, peeling or loosening of the skin, including inside the mouth -trouble passing urine or change in the amount of urine -unusual bleeding or bruising -unusually weak or tired -yellowing of the eyes or skin Side effects that usually do not require medical attention (report to your doctor or health care professional if they continue or are bothersome): -headache -nausea, stomach upset This list may not describe all possible side effects. Call your doctor for medical advice about side effects.  You may report side effects to FDA at 1-800-FDA-1088. Where should I keep my medicine? Keep out of reach of children. Store at room temperature between 20 and 25 degrees C (68 and 77 degrees F). Protect from moisture and heat. Throw away any unused medicine after the expiration date. NOTE: This sheet is a summary. It may not cover all possible information. If you have questions about this medicine, talk to your doctor, pharmacist, or health care provider.  2014, [employer] Standard. (2012-09-11 12:54:16) Diphenhydramine capsules or tablets What is this medicine? DIPHENHYDRAMINE (dye fen HYE dra meen) is an antihistamine. It is used to treat the symptoms of an allergic reaction. It is also used to treat Parkinson's disease. This medicine is also used to prevent and to treat motion sickness and as a nighttime sleep aid. This medicine may be used for other purposes; ask your health care provider or pharmacist if you have questions. COMMON BRAND NAME(S): Alka-Seltzer Plus Allergy, Banophen , Benadryl Allergy Dye Free, Benadryl Allergy Kapgel, Benadryl Allergy Ultratab, Benadryl Allergy, Diphedryl , Diphenhist, Genahist , Q-Dryl, Gretta Began, Valu-Dryl , Vicks ZzzQuil Nightime Sleep-Aid What should I tell my health care provider before I take this medicine? They need to know if you have any of these conditions: -asthma or lung disease -glaucoma -high blood pressure or heart disease -liver disease -pain or difficulty passing urine -prostate trouble -ulcers or other stomach problems -an unusual or allergic reaction to diphenhydramine, other medicines foods, dyes, or preservatives such as sulfites -pregnant or trying to get pregnant -breast-feeding How should I use this medicine? Take this medicine by mouth with a full glass of water. Follow the directions on the prescription label. Take your doses at regular intervals. Do not take your medicine more often than directed. To prevent motion sickness  start taking this medicine 30 to 60 minutes before you leave. Talk to your pediatrician regarding the use  of this medicine in children. Special care may be needed. Patients over 37 years old may have a stronger reaction and need a smaller dose. Overdosage: If you think you have taken too much of this medicine contact a poison control center or emergency room at once. NOTE: This medicine is only for you. Do not share this medicine with others. What if I miss a dose? If you miss a dose, take it as soon as you can. If it is almost time for your next dose, take only that dose. Do not take double or extra doses. What may interact with this medicine? Do not take this medicine with any of the following medications: -MAOIs like Carbex, Eldepryl, Marplan, Nardil, and Parnate This medicine may also interact with the following medications: -alcohol -barbiturates, like phenobarbital -medicines for bladder spasm like oxybutynin, tolterodine -medicines for blood pressure -medicines for depression, anxiety, or psychotic disturbances -medicines for movement abnormalities or Parkinson's disease -medicines for sleep -other medicines for cold, cough or allergy -some medicines for the stomach like chlordiazepoxide, dicyclomine This list may not describe all possible interactions. Give your health care provider a list of all the medicines, herbs, non-prescription drugs, or dietary supplements you use. Also tell them if you smoke, drink alcohol, or use illegal drugs. Some items may interact with your medicine. What should I watch for while using this medicine? Visit your doctor or health care professional for regular check ups. Tell your doctor if your symptoms do not improve or if they get worse. Your mouth may get dry. Chewing sugarless gum or sucking hard candy, and drinking plenty of water may help. Contact your doctor if the problem does not go away or is severe. This medicine may cause dry eyes and blurred  vision. If you wear contact lenses you may feel some discomfort. Lubricating drops may help. See your eye doctor if the problem does not go away or is severe. You may get drowsy or dizzy. Do not drive, use machinery, or do anything that needs mental alertness until you know how this medicine affects you. Do not stand or sit up quickly, especially if you are an older patient. This reduces the risk of dizzy or fainting spells. Alcohol may interfere with the effect of this medicine. Avoid alcoholic drinks. What side effects may I notice from receiving this medicine? Side effects that you should report to your doctor or health care professional as soon as possible: -allergic reactions like skin rash, itching or hives, swelling of the face, lips, or tongue -changes in vision -confused, agitated, nervous -irregular or fast heartbeat -tremor -trouble passing urine -unusual bleeding or bruising -unusually weak or tired Side effects that usually do not require medical attention (report to your doctor or health care professional if they continue or are bothersome): -constipation, diarrhea -drowsy -headache -loss of appetite -stomach upset, vomiting -thick mucous This list may not describe all possible side effects. Call your doctor for medical advice about side effects. You may report side effects to FDA at 1-800-FDA-1088. Where should I keep my medicine? Keep out of the reach of children. Store at room temperature between 15 and 30 degrees C (59 and 86 degrees F). Keep container closed tightly. Throw away any unused medicine after the expiration date. NOTE: This sheet is a summary. It may not cover all possible information. If you have questions about this medicine, talk to your doctor, pharmacist, or health care provider.  2014, Elsevier/Gold Standard. (2007-05-08 17:06:22) MUGA Testing The medical term for the  MUGA test is Electrocardiographic Multi-Gated Acquisition Study with First Pass  Radionuclide Angiography (MUGA). MEANING OF TEST  The purpose of this test to check on how your heart's ventricles are working. It does this by looking at the muscle motion of the heart muscle while it is beating. The ventricles are the large muscular chambers in the lower part of your heart that pump blood to your lungs (right ventricle) and to the rest of your body (left ventricle). The test also checks the ejection fraction of the heart. This is the amount of blood that is being pushed out by the heart. The purpose of a MUGA study is to evaluate right and left ventricular wall motion and ejection fraction. There is no preparation for this test. NORMAL FINDINGS  Normal myocardial ejection fraction and coronary perfusion. Ranges for normal findings may vary among different laboratories and hospitals. You should always check with your doctor after having lab work or other tests done to discuss the meaning of your test results and whether your values are considered within normal limits. OBTAINING THE TEST RESULTS It is your responsibility to obtain your test results. Ask the lab or department performing the test when and how you will get your results. Document Released: 03/09/2005 Document Revised: 04/12/2011 Document Reviewed: 05/05/2006 [facility] Patient Information 2014 [place], [place]. Dexamethasone injection What is this medicine? DEXAMETHASONE (dex a METH a sone) is a corticosteroid. It is used to treat inflammation of the skin, joints, lungs, and other organs. Common conditions treated include asthma, allergies, and arthritis. It is also used for other conditions, like blood disorders and diseases of the adrenal glands. This medicine may be used for other purposes; ask your health care provider or pharmacist if you have questions. COMMON BRAND NAME(S): Decadron, Solurex What should I tell my health care provider before I take this medicine? They need to know if you have any of these  conditions: -blood clotting problems -Cushing's syndrome -diabetes -glaucoma -heart problems or disease -high blood pressure -infection like herpes, measles, tuberculosis, or chickenpox -kidney disease -liver disease -mental problems -myasthenia gravis -osteoporosis -previous heart attack -seizures -stomach, ulcer or intestine disease including colitis and diverticulitis -thyroid problem -an unusual or allergic reaction to dexamethasone, corticosteroids, other medicines, lactose, foods, dyes, or preservatives -pregnant or trying to get pregnant -breast-feeding How should I use this medicine? This medicine is for injection into a muscle, joint, lesion, soft tissue, or vein. It is given by a health care professional in a hospital or clinic setting. Talk to your pediatrician regarding the use of this medicine in children. Special care may be needed. Overdosage: If you think you have taken too much of this medicine contact a poison control center or emergency room at once. NOTE: This medicine is only for you. Do not share this medicine with others. What if I miss a dose? This may not apply. If you are having a series of injections over a prolonged period, try not to miss an appointment. Call your doctor or health care professional to reschedule if you are unable to keep an appointment. What may interact with this medicine? Do not take this medicine with any of the following medications: -mifepristone, RU-486 -vaccines This medicine may also interact with the following medications: -amphotericin B -antibiotics like clarithromycin, erythromycin, and troleandomycin -aspirin and aspirin-like drugs -barbiturates like phenobarbital -carbamazepine -cholestyramine -cholinesterase inhibitors like donepezil, galantamine, rivastigmine, and tacrine -cyclosporine -digoxin -diuretics -ephedrine -male hormones, like estrogens or progestins and birth control  pills -indinavir -isoniazid -ketoconazole -medicines  for diabetes -medicines that improve muscle tone or strength for conditions like myasthenia gravis -NSAIDs, medicines for pain and inflammation, like ibuprofen or naproxen -phenytoin -rifampin -thalidomide -warfarin This list may not describe all possible interactions. Give your health care provider a list of all the medicines, herbs, non-prescription drugs, or dietary supplements you use. Also tell them if you smoke, drink alcohol, or use illegal drugs. Some items may interact with your medicine. What should I watch for while using this medicine? Your condition will be monitored carefully while you are receiving this medicine. If you are taking this medicine for a long time, carry an identification card with your name and address, the type and dose of your medicine, and your doctor's name and address. This medicine may increase your risk of getting an infection. Stay away from people who are sick. Tell your doctor or health care professional if you are around anyone with measles or chickenpox. Talk to your health care provider before you get any vaccines that you take this medicine. If you are going to have surgery, tell your doctor or health care professional that you have taken this medicine within the last twelve months. Ask your doctor or health care professional about your diet. You may need to lower the amount of salt you eat. The medicine can increase your blood sugar. If you are a diabetic check with your doctor if you need help adjusting the dose of your diabetic medicine. What side effects may I notice from receiving this medicine? Side effects that you should report to your doctor or health care professional as soon as possible: -allergic reactions like skin rash, itching or hives, swelling of the face, lips, or tongue -black or tarry stools -change in the amount of urine -changes in vision -confusion, excitement, restlessness,  a false sense of well-being -fever, sore throat, sneezing, cough, or other signs of infection, wounds that will not heal -hallucinations -increased thirst -mental depression, mood swings, mistaken feelings of self importance or of being mistreated -pain in hips, back, ribs, arms, shoulders, or legs -pain, redness, or irritation at the injection site -redness, blistering, peeling or loosening of the skin, including inside the mouth -rounding out of face -swelling of feet or lower legs -unusual bleeding or bruising -unusual tired or weak -wounds that do not heal Side effects that usually do not require medical attention (report to your doctor or health care professional if they continue or are bothersome): -diarrhea or constipation -change in taste -headache -nausea, vomiting -skin problems, acne, thin and shiny skin -touble sleeping -unusual growth of hair on the face or body -weight gain This list may not describe all possible side effects. Call your doctor for medical advice about side effects. You may report side effects to FDA at 1-800-FDA-1088. Where should I keep my medicine? This drug is given in a hospital or clinic and will not be stored at home. NOTE: This sheet is a summary. It may not cover all possible information. If you have questions about this medicine, talk to your doctor, pharmacist, or health care provider.  2014, Elsevier/Gold Standard. (2007-05-11 14:04:12) Ondansetron tablets What is this medicine? ONDANSETRON (on DAN se tron) is used to treat nausea and vomiting caused by chemotherapy. It is also used to prevent or treat nausea and vomiting after surgery. This medicine may be used for other purposes; ask your health care provider or pharmacist if you have questions. COMMON BRAND NAME(S): Zofran What should I tell my health care provider before I take  this medicine? They need to know if you have any of these conditions: -heart disease -history of irregular  heartbeat -liver disease -low levels of magnesium or potassium in the blood -an unusual or allergic reaction to ondansetron, granisetron, other medicines, foods, dyes, or preservatives -pregnant or trying to get pregnant -breast-feeding How should I use this medicine? Take this medicine by mouth with a glass of water. Follow the directions on your prescription label. Take your doses at regular intervals. Do not take your medicine more often than directed. Talk to your pediatrician regarding the use of this medicine in children. Special care may be needed. Overdosage: If you think you have taken too much of this medicine contact a poison control center or emergency room at once. NOTE: This medicine is only for you. Do not share this medicine with others. What if I miss a dose? If you miss a dose, take it as soon as you can. If it is almost time for your next dose, take only that dose. Do not take double or extra doses. What may interact with this medicine? Do not take this medicine with any of the following medications: -apomorphine -certain medicines for fungal infections like fluconazole, itraconazole, ketoconazole, posaconazole, voriconazole -cisapride -dofetilide -dronedarone -pimozide -thioridazine -ziprasidone  This medicine may also interact with the following medications: -carbamazepine -certain medicines for depression, anxiety, or psychotic disturbances -fentanyl -linezolid -MAOIs like Carbex, Eldepryl, Marplan, Nardil, and Parnate -methylene blue (injected into a vein) -other medicines that prolong the QT interval (cause an abnormal heart rhythm) -phenytoin -rifampicin -tramadol This list may not describe all possible interactions. Give your health care provider a list of all the medicines, herbs, non-prescription drugs, or dietary supplements you use. Also tell them if you smoke, drink alcohol, or use illegal drugs. Some items may interact with your medicine. What should  I watch for while using this medicine? Check with your doctor or health care professional right away if you have any sign of an allergic reaction. What side effects may I notice from receiving this medicine? Side effects that you should report to your doctor or health care professional as soon as possible: -allergic reactions like skin rash, itching or hives, swelling of the face, lips or tongue -breathing problems -confusion -dizziness -fast or irregular heartbeat -feeling faint or lightheaded, falls -fever and chills -loss of balance or coordination -seizures -sweating -swelling of the hands or feet -tightness in the chest -tremors -unusually weak or tired Side effects that usually do not require medical attention (report to your doctor or health care professional if they continue or are bothersome): -constipation or diarrhea -headache This list may not describe all possible side effects. Call your doctor for medical advice about side effects. You may report side effects to FDA at 1-800-FDA-1088. Where should I keep my medicine? Keep out of the reach of children. Store between 2 and 30 degrees C (36 and 86 degrees F). Throw away any unused medicine after the expiration date. NOTE: This sheet is a summary. It may not cover all possible information. If you have questions about this medicine, talk to your doctor, pharmacist, or health care provider.  2014, Elsevier/Gold Standard. (2012-10-25 16:27:45)

## 2013-04-25 ENCOUNTER — Encounter (HOSPITAL_COMMUNITY): Payer: Self-pay | Admitting: Pharmacy Technician

## 2013-04-25 ENCOUNTER — Encounter (HOSPITAL_BASED_OUTPATIENT_CLINIC_OR_DEPARTMENT_OTHER): Payer: Medicare Other

## 2013-04-25 ENCOUNTER — Other Ambulatory Visit (HOSPITAL_COMMUNITY): Payer: Self-pay | Admitting: *Deleted

## 2013-04-25 ENCOUNTER — Other Ambulatory Visit: Payer: Self-pay | Admitting: Radiology

## 2013-04-25 DIAGNOSIS — C8580 Other specified types of non-Hodgkin lymphoma, unspecified site: Secondary | ICD-10-CM

## 2013-04-25 DIAGNOSIS — C8589 Other specified types of non-Hodgkin lymphoma, extranodal and solid organ sites: Secondary | ICD-10-CM

## 2013-04-25 NOTE — Progress Notes (Signed)
Chemo teaching reviewed with patient and his daughter. Meds already called into [facility]. To start allopurinol today. MUGA scan tomorrow. Port placement on Friday @ [facility]. Med/chemo calendar given to patient. Consent signed for Rituxan, Adriamycin, Cytoxan, Vincristine, & Prednisone. Patient to receive Elitek Day 1 & 2 and Neulasta on Day 2.

## 2013-04-25 NOTE — Progress Notes (Signed)
Devin Zuniga presented for labwork. Labs per MD order drawn via Peripheral Line 23 gauge needle inserted in rt ac  Good blood return present. Procedure without incident.  Needle removed intact. Patient tolerated procedure well.

## 2013-04-26 ENCOUNTER — Encounter (HOSPITAL_COMMUNITY)
Admission: RE | Admit: 2013-04-26 | Discharge: 2013-04-26 | Disposition: A | Discharge status: Home / Self Care | Payer: Medicare Other | Source: Ambulatory Visit | Attending: Hematology and Oncology | Admitting: Hematology and Oncology

## 2013-04-26 ENCOUNTER — Encounter (HOSPITAL_COMMUNITY): Payer: Self-pay

## 2013-04-26 ENCOUNTER — Encounter (HOSPITAL_BASED_OUTPATIENT_CLINIC_OR_DEPARTMENT_OTHER): Payer: Medicare Other

## 2013-04-26 VITALS — BP 142/80 | HR 96 | Temp 97.6°F | Resp 20 | Wt 164.1 lb

## 2013-04-26 DIAGNOSIS — J449 Chronic obstructive pulmonary disease, unspecified: Secondary | ICD-10-CM

## 2013-04-26 DIAGNOSIS — C8580 Other specified types of non-Hodgkin lymphoma, unspecified site: Secondary | ICD-10-CM

## 2013-04-26 DIAGNOSIS — C8589 Other specified types of non-Hodgkin lymphoma, extranodal and solid organ sites: Secondary | ICD-10-CM

## 2013-04-26 HISTORY — DX: Malignant (primary) neoplasm, unspecified: C80.1

## 2013-04-26 LAB — COMPREHENSIVE METABOLIC PANEL
ALT: 6 U/L (ref 0–53)
AST: 18 U/L (ref 0–37)
Albumin: 2.9 g/dL — ABNORMAL LOW (ref 3.5–5.2)
Alkaline Phosphatase: 91 U/L (ref 39–117)
BILIRUBIN TOTAL: 0.3 mg/dL (ref 0.3–1.2)
BUN: 10 mg/dL (ref 6–23)
CALCIUM: 9.3 mg/dL (ref 8.4–10.5)
CHLORIDE: 102 meq/L (ref 96–112)
CO2: 29 mEq/L (ref 19–32)
CREATININE: 0.68 mg/dL (ref 0.50–1.35)
GFR calc Af Amer: >90 mL/min (ref >90)
GFR calc non Af Amer: >90 mL/min (ref >90)
Glucose, Bld: 128 mg/dL — ABNORMAL HIGH (ref 70–99)
Potassium: 3.8 mEq/L (ref 3.7–5.3)
Sodium: 142 mEq/L (ref 137–147)
TOTAL PROTEIN: 7.1 g/dL (ref 6.0–8.3)

## 2013-04-26 LAB — CBC WITH DIFFERENTIAL/PLATELET
Basophils Absolute: 0 10*3/uL (ref 0.0–0.1)
Basophils Relative: 0 % (ref 0–1)
Eosinophils Absolute: 0.1 10*3/uL (ref 0.0–0.7)
Eosinophils Relative: 1 % (ref 0–5)
HCT: 38 % — ABNORMAL LOW (ref 39.0–52.0)
HEMOGLOBIN: 12.2 g/dL — AB (ref 13.0–17.0)
Lymphocytes Relative: 18 % (ref 12–46)
Lymphs Abs: 1.8 10*3/uL (ref 0.7–4.0)
MCH: 27.5 pg (ref 26.0–34.0)
MCHC: 32.1 g/dL (ref 30.0–36.0)
MCV: 85.8 fL (ref 78.0–100.0)
MONOS PCT: 5 % (ref 3–12)
Monocytes Absolute: 0.5 10*3/uL (ref 0.1–1.0)
NEUTROS ABS: 7.7 10*3/uL (ref 1.7–7.7)
Neutrophils Relative %: 76 % (ref 43–77)
PLATELETS: 418 10*3/uL — AB (ref 150–400)
RBC: 4.43 MIL/uL (ref 4.22–5.81)
RDW: 14.8 % (ref 11.5–15.5)
WBC: 10.1 10*3/uL (ref 4.0–10.5)

## 2013-04-26 LAB — RETICULOCYTES
RBC.: 4.43 MIL/uL (ref 4.22–5.81)
Retic Count, Absolute: 57.6 10*3/uL (ref 19.0–186.0)
Retic Ct Pct: 1.3 % (ref 0.4–3.1)

## 2013-04-26 LAB — HEPATITIS B SURFACE ANTIGEN: Hepatitis B Surface Ag: NEGATIVE

## 2013-04-26 LAB — URIC ACID: URIC ACID, SERUM: 4.8 mg/dL (ref 4.0–7.8)

## 2013-04-26 LAB — HEPATITIS B CORE ANTIBODY, IGM: HEP B C IGM: NONREACTIVE

## 2013-04-26 MED ORDER — OXYCODONE HCL 10 MG PO TABS: ORAL_TABLET | ORAL | Status: DC

## 2013-04-26 MED ORDER — TECHNETIUM TC 99M-LABELED RED BLOOD CELLS IV KIT
25.0000 | PACK | Freq: Once | INTRAVENOUS | Status: AC | PRN
  Administered 2013-04-26: 25 via INTRAVENOUS

## 2013-04-26 MED ORDER — HEPARIN SOD (PORK) LOCK FLUSH 100 UNIT/ML IV SOLN
INTRAVENOUS | Status: AC
  Filled 2013-04-26: qty 5

## 2013-04-26 MED ORDER — OXYCODONE HCL ER 30 MG PO T12A: EXTENDED_RELEASE_TABLET | ORAL | Status: DC

## 2013-04-26 NOTE — Progress Notes (Signed)
[place]  OFFICE PROGRESS NOTE  [name], MD [address] [place] [address]  DIAGNOSIS: Lymphoma malignant, large cell - Plan: CBC with Differential, Reticulocytes, Comprehensive metabolic panel, Uric acid, CBC with Differential, Reticulocytes, Comprehensive metabolic panel, CBC with Differential, Basic metabolic panel, Uric acid, CANCELED: Basic metabolic panel  Chief Complaint  Patient presents with   Diffuse large B-cell lymphoma   Extensive bone involvement on MRI    CURRENT THERAPY: Recently underwent biopsy of right axillary lymph node for diagnosis  INTERVAL HISTORY: Devin Zuniga 68 y.o. male returns for followup after axillary lymph node biopsy for diagnosis to explain diffuse lymphadenopathy with right lower extremity swelling and intra-abdominal lymph node enlargement producing venous and lymphatic blockade.  He received a cortisone injection in his right hip from his orthopedic surgeon recently. Pain in the right lower extremity still severe requiring 70 mg of hydrocodone plus APAP per 24 hours. He has not had worsening swelling of the right lower extremity. He denies any PND, fever, night sweats, chest pain, or palpitations. He denies any cough, wheezing, sore throat, earache, peripheral paresthesias, but does have pain in the mid right femur.  MEDICAL HISTORY: Past Medical History  Diagnosis Date   Cancer     INTERIM HISTORY: has Piriformis syndrome of right side; Right hip pain; Back pain; Lymphadenopathy, generalized; and Lymphoma malignant, large cell on his problem list.    ALLERGIES:  has No Known Allergies.  MEDICATIONS: has a current medication list which includes the following prescription(s): allopurinol, hydrocodone-acetaminophen, lidocaine-prilocaine, liniments, naproxen sodium, polyethylene glycol, sennosides, capsaicin, metoclopramide, oxycodone hcl, oxycodone hcl er, prednisone, and  prochlorperazine.  SURGICAL HISTORY: History reviewed. No pertinent past surgical history.  FAMILY HISTORY: family history is not on file.  SOCIAL HISTORY:  reports that he has been smoking Cigarettes.  He has been smoking about 0.25 packs per day. He does not have any smokeless tobacco history on file. He reports that he does not drink alcohol or use illicit drugs.  REVIEW OF SYSTEMS:  Other than that discussed above is noncontributory.  PHYSICAL EXAMINATION: ECOG PERFORMANCE STATUS: 2 - Symptomatic, <50% confined to bed  Blood pressure 142/80, pulse 96, temperature 97.6 F (36.4 C), temperature source Oral, resp. rate 20, weight 164 lb 1.6 oz (74.435 kg), SpO2 96.00%.  GENERAL:alert, no distress and comfortable SKIN: skin color, texture, turgor are normal, no rashes or significant lesions EYES: PERLA; Conjunctiva are pink and non-injected, sclera clear SINUSES: No redness or tenderness over maxillary or ethmoid sinuses OROPHARYNX:no exudate, no erythema on lips, buccal mucosa, or tongue. NECK: supple, thyroid normal size, non-tender, without nodularity. No masses CHEST: Increased AP diameter with no breast masses. LYMPH: Right axillary lymph node biopsy site is well-healed. Bilateral axillary nodes as well as bilateral inguinal lymph nodes palpable. LUNGS: clear to auscultation and percussion with normal breathing effort HEART: regular rate & rhythm and no murmurs. ABDOMEN:abdomen soft, non-tender and normal bowel sounds MUSCULOSKELETAL:no cyanosis of digits and no clubbing. Range of motion normal. Tenderness right mid femur. Posterior swelling of the right lower extremity with negative Homans sign. NEURO: alert & oriented x 3 with fluent speech, no focal motor/sensory deficits   LABORATORY DATA: Office Visit on 04/26/2013  Component Date Value Ref Range Status   Uric Acid, Serum 04/26/2013 4.8  4.0 - 7.8 mg/dL Final   WBC 04/26/2013 10.1  4.0 - 10.5 K/uL Final   RBC  04/26/2013 4.43  4.22 -  5.81 MIL/uL Final   Hemoglobin 04/26/2013 12.2* 13.0 - 17.0 g/dL Final   HCT 04/26/2013 38.0* 39.0 - 52.0 % Final   MCV 04/26/2013 85.8  78.0 - 100.0 fL Final   MCH 04/26/2013 27.5  26.0 - 34.0 pg Final   MCHC 04/26/2013 32.1  30.0 - 36.0 g/dL Final   RDW 04/26/2013 14.8  11.5 - 15.5 % Final   Platelets 04/26/2013 418* 150 - 400 K/uL Final   Neutrophils Relative % 04/26/2013 76  43 - 77 % Final   Neutro Abs 04/26/2013 7.7  1.7 - 7.7 K/uL Final   Lymphocytes Relative 04/26/2013 18  12 - 46 % Final   Lymphs Abs 04/26/2013 1.8  0.7 - 4.0 K/uL Final   Monocytes Relative 04/26/2013 5  3 - 12 % Final   Monocytes Absolute 04/26/2013 0.5  0.1 - 1.0 K/uL Final   Eosinophils Relative 04/26/2013 1  0 - 5 % Final   Eosinophils Absolute 04/26/2013 0.1  0.0 - 0.7 K/uL Final   Basophils Relative 04/26/2013 0  0 - 1 % Final   Basophils Absolute 04/26/2013 0.0  0.0 - 0.1 K/uL Final   Retic Ct Pct 04/26/2013 1.3  0.4 - 3.1 % Final   RBC. 04/26/2013 4.43  4.22 - 5.81 MIL/uL Final   Retic Count, Manual 04/26/2013 57.6  19.0 - 186.0 K/uL Final   Sodium 04/26/2013 142  137 - 147 mEq/L Final   Potassium 04/26/2013 3.8  3.7 - 5.3 mEq/L Final   Chloride 04/26/2013 102  96 - 112 mEq/L Final   CO2 04/26/2013 29  19 - 32 mEq/L Final   Glucose, Bld 04/26/2013 128* 70 - 99 mg/dL Final   BUN 04/26/2013 10  6 - 23 mg/dL Final   Creatinine, Ser 04/26/2013 0.68  0.50 - 1.35 mg/dL Final   Calcium 04/26/2013 9.3  8.4 - 10.5 mg/dL Final   Total Protein 04/26/2013 7.1  6.0 - 8.3 g/dL Final   Albumin 04/26/2013 2.9* 3.5 - 5.2 g/dL Final   AST 04/26/2013 18  0 - 37 U/L Final   ALT 04/26/2013 6  0 - 53 U/L Final   Alkaline Phosphatase 04/26/2013 91  39 - 117 U/L Final   Total Bilirubin 04/26/2013 0.3  0.3 - 1.2 mg/dL Final   GFR calc non Af Amer 04/26/2013 >90  >90 mL/min Final   GFR calc Af Amer 04/26/2013 >90  >90 mL/min Final   Comment: (NOTE)                           The eGFR has been calculated using the CKD EPI equation.                          This calculation has not been validated in all clinical situations.                          eGFR's persistently <90 mL/min signify possible Chronic Kidney                          Disease.  Infusion on 04/25/2013  Component Date Value Ref Range Status   Hepatitis B Surface Ag 04/25/2013 NEGATIVE  NEGATIVE Final   Performed at [facility]   Hep B C IgM 04/25/2013 NON REACTIVE  NON REACTIVE Final   Comment: (NOTE)  High levels of Hepatitis B Core IgM antibody are detectable                          during the acute stage of Hepatitis B. This antibody is used                          to differentiate current from past HBV infection.                          Performed at [facility] Visit on 04/16/2013  Component Date Value Ref Range Status   WBC 04/16/2013 11.8* 4.0 - 10.5 K/uL Final   RBC 04/16/2013 4.78  4.22 - 5.81 MIL/uL Final   Hemoglobin 04/16/2013 13.2  13.0 - 17.0 g/dL Final   HCT 04/16/2013 41.8  39.0 - 52.0 % Final   MCV 04/16/2013 87.4  78.0 - 100.0 fL Final   MCH 04/16/2013 27.6  26.0 - 34.0 pg Final   MCHC 04/16/2013 31.6  30.0 - 36.0 g/dL Final   RDW 04/16/2013 15.0  11.5 - 15.5 % Final   Platelets 04/16/2013 443* 150 - 400 K/uL Final   Neutrophils Relative % 04/16/2013 78* 43 - 77 % Final   Neutro Abs 04/16/2013 9.3* 1.7 - 7.7 K/uL Final   Lymphocytes Relative 04/16/2013 13  12 - 46 % Final   Lymphs Abs 04/16/2013 1.5  0.7 - 4.0 K/uL Final   Monocytes Relative 04/16/2013 8  3 - 12 % Final   Monocytes Absolute 04/16/2013 1.0  0.1 - 1.0 K/uL Final   Eosinophils Relative 04/16/2013 0  0 - 5 % Final   Eosinophils Absolute 04/16/2013 0.0  0.0 - 0.7 K/uL Final   Basophils Relative 04/16/2013 0  0 - 1 % Final   Basophils Absolute 04/16/2013 0.0  0.0 - 0.1 K/uL Final   Sodium 04/16/2013 140  137 - 147 mEq/L Final     Potassium 04/16/2013 4.3  3.7 - 5.3 mEq/L Final   Chloride 04/16/2013 103  96 - 112 mEq/L Final   CO2 04/16/2013 27  19 - 32 mEq/L Final   Glucose, Bld 04/16/2013 92  70 - 99 mg/dL Final   BUN 04/16/2013 11  6 - 23 mg/dL Final   Creatinine, Ser 04/16/2013 0.71  0.50 - 1.35 mg/dL Final   Calcium 04/16/2013 9.2  8.4 - 10.5 mg/dL Final   Total Protein 04/16/2013 7.3  6.0 - 8.3 g/dL Final   Albumin 04/16/2013 3.1* 3.5 - 5.2 g/dL Final   AST 04/16/2013 14  0 - 37 U/L Final   ALT 04/16/2013 6  0 - 53 U/L Final   Alkaline Phosphatase 04/16/2013 95  39 - 117 U/L Final   Total Bilirubin 04/16/2013 0.4  0.3 - 1.2 mg/dL Final   GFR calc non Af Amer 04/16/2013 >90  >90 mL/min Final   GFR calc Af Amer 04/16/2013 >90  >90 mL/min Final   Comment: (NOTE)                          The eGFR has been calculated using the CKD EPI equation.                          This calculation has not been validated in all clinical situations.  eGFR's persistently <90 mL/min signify possible Chronic Kidney                          Disease.   LDH 04/16/2013 397* 94 - 250 U/L Final   Beta-2 Microglobulin 04/16/2013 3.25* <=2.51 mg/L Final   Performed at [facility] Outpatient Visit on 04/12/2013  Component Date Value Ref Range Status   Glucose-Capillary 04/12/2013 102* 70 - 99 mg/dL Final  Admission on 03/30/2013, Discharged on 03/30/2013  Component Date Value Ref Range Status   WBC 03/30/2013 10.5  4.0 - 10.5 K/uL Final   RBC 03/30/2013 4.76  4.22 - 5.81 MIL/uL Final   Hemoglobin 03/30/2013 13.3  13.0 - 17.0 g/dL Final   HCT 03/30/2013 41.8  39.0 - 52.0 % Final   MCV 03/30/2013 87.8  78.0 - 100.0 fL Final   MCH 03/30/2013 27.9  26.0 - 34.0 pg Final   MCHC 03/30/2013 31.8  30.0 - 36.0 g/dL Final   RDW 03/30/2013 14.7  11.5 - 15.5 % Final   Platelets 03/30/2013 348  150 - 400 K/uL Final   Neutrophils Relative % 03/30/2013 73  43 - 77 % Final    Neutro Abs 03/30/2013 7.7  1.7 - 7.7 K/uL Final   Lymphocytes Relative 03/30/2013 20  12 - 46 % Final   Lymphs Abs 03/30/2013 2.1  0.7 - 4.0 K/uL Final   Monocytes Relative 03/30/2013 6  3 - 12 % Final   Monocytes Absolute 03/30/2013 0.6  0.1 - 1.0 K/uL Final   Eosinophils Relative 03/30/2013 1  0 - 5 % Final   Eosinophils Absolute 03/30/2013 0.2  0.0 - 0.7 K/uL Final   Basophils Relative 03/30/2013 0  0 - 1 % Final   Basophils Absolute 03/30/2013 0.0  0.0 - 0.1 K/uL Final   Sodium 03/30/2013 141  137 - 147 mEq/L Final   Potassium 03/30/2013 3.8  3.7 - 5.3 mEq/L Final   Chloride 03/30/2013 100  96 - 112 mEq/L Final   CO2 03/30/2013 29  19 - 32 mEq/L Final   Glucose, Bld 03/30/2013 96  70 - 99 mg/dL Final   BUN 03/30/2013 10  6 - 23 mg/dL Final   Creatinine, Ser 03/30/2013 0.79  0.50 - 1.35 mg/dL Final   Calcium 03/30/2013 9.5  8.4 - 10.5 mg/dL Final   Total Protein 03/30/2013 7.6  6.0 - 8.3 g/dL Final   Albumin 03/30/2013 3.4* 3.5 - 5.2 g/dL Final   AST 03/30/2013 14  0 - 37 U/L Final   ALT 03/30/2013 8  0 - 53 U/L Final   Alkaline Phosphatase 03/30/2013 98  39 - 117 U/L Final   Total Bilirubin 03/30/2013 0.3  0.3 - 1.2 mg/dL Final   GFR calc non Af Amer 03/30/2013 >90  >90 mL/min Final   GFR calc Af Amer 03/30/2013 >90  >90 mL/min Final   Comment: (NOTE)                          The eGFR has been calculated using the CKD EPI equation.                          This calculation has not been validated in all clinical situations.  eGFR's persistently <90 mL/min signify possible Chronic Kidney                          Disease.    PATHOLOGY:  for JAMERE, STIDHAM ([id number]) Patient: EARNESTINE, TUOHEY Collected: 04/18/2013 Client: [facility] Accession: [id number] Received: 04/18/2013 Markus Daft DOB: February 26, 1945 Age: 35 Gender: M Reported: 04/23/2013 [address] Patient Ph: [phone] MRN #: [id number] [place],  [place] [address] Visit #: [id number] Chart #: Phone:  Fax: CC: Farrel Gobble, MD REPORT OF SURGICAL PATHOLOGY FINAL DIAGNOSIS Diagnosis Lymph node, needle/core biopsy, Right Axillary Lymph Node - LARGE B-CELL LYMPHOMA. Microscopic Comment LYMPHOMA Histologic type: Non-Hodgkin lymphoma, large cell type. Grade (if applicable): High grade. Flow cytometry: Not performed. Immunohistochemical stains: Cytokeratin AE1/AE3, S-100, LCA, BCL-2, BCL-6, CD20, CD79a, CD10, CD3, CD43, CD138, and CD30 with appropriate controls. Touch preps/imprints: Not performed. Comments: The needle core biopsies show soft tissue effaced by a dense and relatively monomorphic lymphoproliferative process characterized by predominance of large lymphoid cells with vesicular chromatin and small nucleoli associated with brisk mitosis and apoptosis. In these small biopsies, the appearance is diffuse with lack of obvious follicular structures. To further evaluate this process, immunohistochemical stains were performed and show that the large atypical lymphoid cells are positive for LCA, CD20, CD79a, CD10, BCL-2 and BCL-6. No appreciable positivity is seen with CD138, CD30, cytokeratin AE1/AE3 or S-100. There is a minor admixed T-cell component in the background as mostly highlighted by CD3 and CD43. The overall features are compatible with large B-cell lymphoma. (BNS:ecj 04/23/2013) Susanne Greenhouse MD Pathologist, Electronic Signature (Case signed 04/23/2013) Specimen Gross and Clinical Information Specimen(s) Obtained: Lymph node, needle/core biopsy, Right Axillary Lymph Node Specimen Clinical Information Diffuse Lymphadenopathy; Possible Metastatic Disease (jmc) 1 of 2 FINAL for Siddiqi, Muhamad ([id number]) Gross Received in saline are three cores of gray white soft tissue ranging from 0.9 x 0.1 cm to 1.5 x 0.1 cm. A portion of the longest core is submitted in RPMI for possible flow cytometry, with the remaining  specimen in one block for routine histology. (SW:caf 04/18/13) Stain(s) used in Diagnosis: The following stain(s) were used in diagnosing the case: CD 138, CD 30, CK AE1AE3, CD45 (LCA), *S-100, BCl 6 , CD-10, CD 3, CD 43, BCl 2, CD 79a, CD 20. The control(s) stained appropriately. Disclaimer Some of these immunohistochemical stains may have been developed and the performance characteristics determined by [facility]. Some may not have been cleared or approved by the U.S. Food and Drug Administration. The FDA has determined that such clearance or approval is not necessary. This test is used for clinical purposes. It should not be regarded as investigational or for research. This laboratory is certified under the [place] (CLIA-88) as qualified to perform high complexity clinical laboratory testing. Report signed out from the following location(s) Technical Component performed at [facility] [place], [place], [place] [address]. CLIA #: [id number], Technical Component performed at [facility], [place], [place] [address]. CLIA #: [id number], Interpretation performed at [place], [place], [place] [address]. CLIA #: [id number],    Urinalysis No results found for this basename: colorurine,  appearanceur,  labspec,  phurine,  glucoseu,  hgbur,  bilirubinur,  ketonesur,  proteinur,  urobilinogen,  nitrite,  leukocytesur    RADIOGRAPHIC STUDIES: Nm Cardiac Muga Rest  04/26/2013   CLINICAL DATA:  Malignant large cell lymphoma, baseline prior  to cardiotoxic chemotherapy  EXAM: NUCLEAR MEDICINE CARDIAC BLOOD POOL IMAGING (MUGA)  TECHNIQUE: Cardiac multi-gated acquisition was performed at rest following intravenous injection of Tc-57mlabeled red blood cells.  RADIOPHARMACEUTICALS:  25MILLI CURIE ULTRATAG TECHNETIUM TC 69M-LABELED RED BLOOD CELLS IV KIT   COMPARISON:  None  FINDINGS: Calculated left ventricular ejection fraction is 64%, normal.  This was obtained at a cardiac rate of 93 beats per min.  Wall motion analysis of the left ventricle in 3 projections is normal.  IMPRESSION: Normal left ventricular ejection fraction of 64%.  Normal LV wall motion.   Electronically Signed   By: MLavonia DanaM.D.   On: 04/26/2013 15:05   Ct Abdomen Pelvis W Contrast  03/30/2013   CLINICAL DATA:  Right hip and thigh pain  EXAM: CT ABDOMEN AND PELVIS WITH CONTRAST  TECHNIQUE: Multidetector CT imaging of the abdomen and pelvis was performed using the standard protocol following bolus administration of intravenous contrast.  CONTRAST:  1052mOMNIPAQUE IOHEXOL 300 MG/ML  SOLN  COMPARISON:  None.  FINDINGS: No pleural or pericardial effusion identified. The lung bases are clear. Mild changes of centrilobular emphysema identified. .  1.9 cm low attenuation structure within the left hepatic lobe is indeterminate, image 16/series 2. There is a indeterminate low attenuation structure in the right hepatic lobe measuring 1.3 cm, image 18/series 2. Within the medial aspect of the posterior right hepatic lobe there is a 1.7 x 2.2 cm indeterminate low attenuation lesion, image 16/series 2. The gallbladder appears normal. No biliary dilatation. Normal appearance of the pancreas. The spleen is normal in size measuring 11 cm. There are several foci of low attenuation within the spleen. The largest 3.5 cm, image 40/series 5.  The adrenal glands are both normal. Normal appearance of both kidneys. The urinary bladder is normal.  Calcification within the prostate gland is noted.  Calcified atherosclerotic disease involves the abdominal aorta. There is no aneurysm. Periaortic lymph node/soft tissue measures 1.8 cm, image 46/series 2. There is a 1 cm aortocaval lymph node, image 44/series 2. Multiple enlarged right iliac lymph nodes are identified. Index right common iliac lymph node measures 1.8  cm, image 53/series 2. Large right external iliac lymph node measures 3.6 cm, image 67/series 2. Review of the bony structures shows definite evidence for lytic or sclerotic bone lesion.  There is no ascites or focal fluid collections identified within the abdomen or pelvis. The stomach appears normal. The small bowel loops have a normal course and caliber and there is no evidence for obstruction. The colon is on unremarkable.  Hyperdense mass within the medial aspect of the right upper thigh a measures 4.5 x 5.6 cm, image 88/series 2.  IMPRESSION: 1. Multifocal areas of low attenuation within the liver and spleen are worrisome for metastatic disease. 2. Enlarged retroperitoneal and right iliac lymph nodes are worrisome for metastatic adenopathy. 3. Indeterminate mass within the medial aspect of the right upper thigh is also favored to represent a focus of metastatic disease. 4. Further evaluation with PET-CT and tissue sampling is recommended.   Electronically Signed   By: TaKerby Moors.D.   On: 03/30/2013 17:19   Nm Pet Image Initial (pi) Skull Base To Thigh  04/12/2013   CLINICAL DATA:  Initial treatment strategy for lymphadenopathy and right femur mass.  EXAM: NUCLEAR MEDICINE PET SKULL BASE TO THIGH  TECHNIQUE: 9.1 mCi F-18 FDG was injected intravenously. Full-ring PET imaging was performed from the skull base to thigh after the radiotracer. CT  data was obtained and used for attenuation correction and anatomic localization.  FASTING BLOOD GLUCOSE:  Value: 102 mg/dl  COMPARISON:  CT abdomen pelvis dated 03/30/2013  FINDINGS: NECK  No hypermetabolic lymph nodes in the neck.  CHEST  Widespread thoracic nodal metastases, including:  --1.4 cm short axis right supraclavicular/ subpectoral node (series 4/ image 49), max SUV 10.5  --3.0 cm short axis right axillary node (series 4/image 57), max SUV 11.8  --4.5 x 3.7 cm AP window nodal mass, partially necrotic (series 4/image 70), max SUV 9.1  --2.2 cm short axis  subcarinal node (series 4/image 81), max SUV 54.2  --hypermetabolic bilateral hilar nodes, difficult to measure on unenhanced CT, max SUV 9.3  Lungs are notable for moderate centrilobular emphysematous changes. No suspicious pulmonary nodules.  ABDOMEN/PELVIS  No abnormal hypermetabolic activity within the liver and bilateral adrenal glands. Possible 11 mm left adrenal nodule (series 4/image 117), without convincing hypermetabolism on PET.  Focal hypermetabolism with soft tissue prominence superiorly along the distal pancreatic body/ tail (series 4/image 114), max SUV 7.7. Although not well visualized on CT, this appearance is worrisome for primary pancreatic neoplasm or metastasis. Additional focus of hypermetabolism in the pancreatic head, max SUV 8.7 (PET image 109), although without corresponding CT abnormality on recent CT.  Multifocal hypermetabolic splenic lesions, max SUV 9.0, compatible with metastases.  Multiple retroperitoneal/right pelvic lymph nodes, including  --1.7 cm short axis right common iliac node (series 4/ image 135), max SUV 12.1  -- 6.1 x 5.1 cm right external iliac nodal metastasis (series 4/ image 173), max SUV 13.3  SKELETON  Multifocal/widespread osseous metastases, including:  --right humeral head lesion without definite CT correlate, max SUV 8.7 (PET image 33), with additional hypermetabolism in the adjacent musculature  --suspected vague permeative lesion in the right femoral head, max SUV 12.7, with additional hypermetabolism in the adjacent musculature, particularly in the medial right thigh (series 4/image 203)  --additional hypermetabolic lesions in the left scapula, posterior left 12th rib, left posterior elements at L5, right iliac bone, and right sacrum  Multiple healing left lateral rib fractures, non FDG avid.  IMPRESSION: Widespread nodal metastases in the chest, abdomen, and pelvis, as described above. Consider percutaneous sampling of a dominant right axillary nodal for  tissue characterization.  Additional hypermetabolic lesions in the pancreas and spleen, suspicious for metastases, although primary pancreatic neoplasm along the distal pancreatic body/tail is possible.  Multifocal osseous metastases, including dominant lesions in the right humeral head and right femoral head.  Multiple healing left rib fractures, non FDG avid.   Electronically Signed   By: Julian Hy M.D.   On: 04/12/2013 12:39   US Biopsy  04/18/2013   CLINICAL DATA:  68 year old with diffuse lymphadenopathy. Tissue diagnosis is needed.  EXAM: ULTRASOUND GUIDED CORE BIOPSY OF RIGHT AXILLARY LYMPH NODE  MEDICATIONS: None  Total Moderate Sedation Time: None  PROCEDURE: The procedure, risks, benefits, and alternatives were explained to the patient. Questions regarding the procedure were encouraged and answered. The patient understands and consents to the procedure.  The right axilla was prepped with Betadine and a sterile drape was placed. The skin was anesthetized with 1% lidocaine. Using ultrasound guidance, an 18 gauge core needle was directed into the right axillary lesion/ lymph node. A total of 4 core biopsies were obtained. The specimens were placed in saline.  COMPLICATIONS: None.  FINDINGS: There is a large heterogeneous structure in the right axilla consistent with an enlarged and abnormal lymph node. Core biopsy needle  placement was confirmed within the lesion. No significant bleeding following the core biopsies.  IMPRESSION: Ultrasound-guided core biopsies of the enlarged right axillary lymph node.   Electronically Signed   By: Markus Daft M.D.   On: 04/18/2013 17:26   US Venous Img Lower Unilateral Right  03/28/2013   CLINICAL DATA:  Right leg pain and swelling.  EXAM: Right LOWER EXTREMITY VENOUS DOPPLER ULTRASOUND  TECHNIQUE: Gray-scale sonography with graded compression, as well as color Doppler and duplex ultrasound, were performed to evaluate the deep venous system from the level of the  common femoral vein through the popliteal and proximal calf veins. Spectral Doppler was utilized to evaluate flow at rest and with distal augmentation maneuvers.  COMPARISON:  None.  FINDINGS: Thrombus within deep veins:  None visualized.  Compressibility of deep veins:  Normal.  Duplex waveform respiratory phasicity:  Normal.  Duplex waveform response to augmentation:  Normal.  Venous reflux:  Not evaluated.  Other findings: The posterior tibial and peroneal veins appear patent where visualized below the knee.  Subcutaneous edema is visualized in the leg.  Mildly enlarged lymph node identified in the right groin region.  IMPRESSION: No evidence for DVT in the right lower extremity.  Mildly enlarged right inguinal lymph node.  Subcutaneous edema in the leg.   Electronically Signed   By: Misty Stanley M.D.   On: 03/28/2013 13:55    ASSESSMENT:  #1. 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 Femoral involvement with disease has been documented with probable sciatic nerve adding to his pain. #2. Chronic obstructive pulmonary disease.   PLAN:  #1. LifePort to be inserted on 04/27/2013. #2. Continue MiraLAX daily along with 2 stool softeners daily along with Senokot 2-4 tablets nightly. #3. Handicapped parking placard filled out. #4. Chemotherapy to start on 04/30/2013  with R.-CHOP with Neulasta and Rasburicase support. #5. Office visit 05/07/2013 with CBC, BMP, and uric acid level. Patient was told to drink copious amounts of water following treatment on Monday and also in general so that bowel movements can be a soft as possible.  All questions were answered. The patient knows to call the clinic with any problems, questions or concerns. We can certainly see the patient much sooner if necessary.   I spent 40 minutes counseling the patient face to face. The total time spent in the appointment was 55  minutes.    Doroteo Bradford, MD 04/27/2013 6:46 AM

## 2013-04-26 NOTE — Patient Instructions (Signed)
[facility] Discharge Instructions  RECOMMENDATIONS MADE BY THE CONSULTANT AND ANY TEST RESULTS WILL BE SENT TO YOUR REFERRING PHYSICIAN.  We will see you April 7th. Increase your stool softeners at told by the doctor. Prescription for pain meds given.  Thank you for choosing [facility] to provide your oncology and hematology care.  To afford each patient quality time with our providers, please arrive at least 15 minutes before your scheduled appointment time.  With your help, our goal is to use those 15 minutes to complete the necessary work-up to ensure our physicians have the information they need to help with your evaluation and healthcare recommendations.    Effective January 1st, 2014, we ask that you re-schedule your appointment with our physicians should you arrive 10 or more minutes late for your appointment.  We strive to give you quality time with our providers, and arriving late affects you and other patients whose appointments are after yours.    Again, thank you for choosing [facility].  Our hope is that these requests will decrease the amount of time that you wait before being seen by our physicians.       _____________________________________________________________  Should you have questions after your visit to [facility], please contact our office at [phone] between the hours of 8:30 a.m. and 5:00 p.m.  Voicemails left after 4:30 p.m. will not be returned until the following business day.  For prescription refill requests, have your pharmacy contact our office with your prescription refill request.

## 2013-04-27 ENCOUNTER — Encounter (HOSPITAL_COMMUNITY): Payer: Self-pay | Admitting: *Deleted

## 2013-04-27 ENCOUNTER — Ambulatory Visit (HOSPITAL_COMMUNITY)
Admission: RE | Admit: 2013-04-27 | Discharge: 2013-04-27 | Disposition: A | Discharge status: Home / Self Care | Payer: Medicare Other | Source: Ambulatory Visit | Attending: Hematology and Oncology | Admitting: Hematology and Oncology

## 2013-04-27 ENCOUNTER — Encounter (HOSPITAL_COMMUNITY): Payer: Self-pay

## 2013-04-27 ENCOUNTER — Other Ambulatory Visit (HOSPITAL_COMMUNITY): Payer: Self-pay | Admitting: Hematology and Oncology

## 2013-04-27 DIAGNOSIS — Z79899 Other long term (current) drug therapy: Secondary | ICD-10-CM | POA: Insufficient documentation

## 2013-04-27 DIAGNOSIS — C8580 Other specified types of non-Hodgkin lymphoma, unspecified site: Secondary | ICD-10-CM

## 2013-04-27 DIAGNOSIS — C8589 Other specified types of non-Hodgkin lymphoma, extranodal and solid organ sites: Secondary | ICD-10-CM | POA: Insufficient documentation

## 2013-04-27 DIAGNOSIS — M7989 Other specified soft tissue disorders: Secondary | ICD-10-CM | POA: Insufficient documentation

## 2013-04-27 HISTORY — PX: PORTACATH PLACEMENT: SHX2246

## 2013-04-27 LAB — CBC
HEMATOCRIT: 38 % — AB (ref 39.0–52.0)
HEMOGLOBIN: 12.1 g/dL — AB (ref 13.0–17.0)
MCH: 26.9 pg (ref 26.0–34.0)
MCHC: 31.8 g/dL (ref 30.0–36.0)
MCV: 84.6 fL (ref 78.0–100.0)
Platelets: 441 10*3/uL — ABNORMAL HIGH (ref 150–400)
RBC: 4.49 MIL/uL (ref 4.22–5.81)
RDW: 14.9 % (ref 11.5–15.5)
WBC: 11.1 10*3/uL — ABNORMAL HIGH (ref 4.0–10.5)

## 2013-04-27 LAB — PROTIME-INR
INR: 1.06 (ref 0.00–1.49)
Prothrombin Time: 13.6 seconds (ref 11.6–15.2)

## 2013-04-27 LAB — APTT: APTT: 29 s (ref 24–37)

## 2013-04-27 MED ORDER — CEFAZOLIN SODIUM-DEXTROSE 2-3 GM-% IV SOLR
2.0000 g | INTRAVENOUS | Status: AC
  Administered 2013-04-27: 2 g via INTRAVENOUS

## 2013-04-27 MED ORDER — HEPARIN SOD (PORK) LOCK FLUSH 100 UNIT/ML IV SOLN
INTRAVENOUS | Status: AC | PRN
  Administered 2013-04-27: 500 [IU]

## 2013-04-27 MED ORDER — OXYCODONE HCL 5 MG PO TABS
10.0000 mg | ORAL_TABLET | Freq: Once | ORAL | Status: AC
  Administered 2013-04-27: 10 mg via ORAL
  Filled 2013-04-27: qty 2

## 2013-04-27 MED ORDER — MIDAZOLAM HCL 2 MG/2ML IJ SOLN
INTRAMUSCULAR | Status: AC
  Filled 2013-04-27: qty 6

## 2013-04-27 MED ORDER — MIDAZOLAM HCL 2 MG/2ML IJ SOLN
INTRAMUSCULAR | Status: AC | PRN
  Administered 2013-04-27: 1 mg via INTRAVENOUS
  Administered 2013-04-27 (×4): 0.5 mg via INTRAVENOUS

## 2013-04-27 MED ORDER — SODIUM CHLORIDE 0.9 % IV SOLN
INTRAVENOUS | Status: DC
  Administered 2013-04-27: 10 mL/h via INTRAVENOUS

## 2013-04-27 MED ORDER — LIDOCAINE HCL 1 % IJ SOLN
INTRAMUSCULAR | Status: AC
  Filled 2013-04-27: qty 20

## 2013-04-27 MED ORDER — HEPARIN SOD (PORK) LOCK FLUSH 100 UNIT/ML IV SOLN
INTRAVENOUS | Status: AC
  Filled 2013-04-27: qty 5

## 2013-04-27 MED ORDER — FENTANYL CITRATE 0.05 MG/ML IJ SOLN
INTRAMUSCULAR | Status: AC | PRN
  Administered 2013-04-27: 25 ug via INTRAVENOUS
  Administered 2013-04-27: 50 ug via INTRAVENOUS
  Administered 2013-04-27: 25 ug via INTRAVENOUS

## 2013-04-27 MED ORDER — LIDOCAINE-EPINEPHRINE (PF) 2 %-1:200000 IJ SOLN
INTRAMUSCULAR | Status: AC
  Filled 2013-04-27: qty 20

## 2013-04-27 MED ORDER — FENTANYL CITRATE 0.05 MG/ML IJ SOLN
INTRAMUSCULAR | Status: AC
  Filled 2013-04-27: qty 6

## 2013-04-27 MED ORDER — CEFAZOLIN SODIUM-DEXTROSE 2-3 GM-% IV SOLR
INTRAVENOUS | Status: AC
  Filled 2013-04-27: qty 50

## 2013-04-27 NOTE — Progress Notes (Signed)
Distress screening completed. No referral needed @ this time.

## 2013-04-27 NOTE — Procedures (Signed)
Interventional Radiology Procedure Note  Procedure: Placement of a right IJ approach single lumen PowerPort.  Tip is positioned at the superior cavoatrial junction and catheter is ready for immediate use.  Complications: No immediate Recommendations:  - Ok to shower tomorrow - Do not submerge for 7 days - Routine line care   Signed,  Pilar Corrales K. Reis Goga, MD [facility]   

## 2013-04-27 NOTE — H&P (Signed)
Chief Complaint: "I am here for a port." Referring Physician: Dr. Barnet Glasgow HPI: Devin Zuniga is an 68 y.o. 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 He has RLE involvement with swelling. IR received request for image guided port a catheter placement. He denies any fever or chills or open wounds. He denies any chest pain, shortness of breath or palpitations. He denies any active signs of bleeding or excessive bruising. He denies any recent fever or chills. The patient denies any history of sleep apnea or chronic oxygen use. He has not previously had sedation, but denies any known allergies.    Past Medical History:  Past Medical History  Diagnosis Date  . Cancer     Past Surgical History: History reviewed. No pertinent past surgical history.  Family History: History reviewed. No pertinent family history.  Social History:  reports that he has been smoking Cigarettes.  He has been smoking about 0.25 packs per day. He does not have any smokeless tobacco history on file. He reports that he does not drink alcohol or use illicit drugs.  Allergies: No Known Allergies  Medications:   Medication List    ASK your doctor about these medications       allopurinol 300 MG tablet  Commonly known as:  ZYLOPRIM  Take 1 tablet (300 mg total) by mouth daily.     BEN GAY EX  Apply 1 application topically daily as needed (for pain in hip and knee).     capsaicin 0.025 % cream  Commonly known as:  ZOSTRIX  Apply 1 application topically daily as needed (for pain in hip and knee).     HYDROcodone-acetaminophen 10-325 MG per tablet  Commonly known as:  NORCO  Up to 2 tablets every 4 hours as needed for pain. Take MiraLAX daily along with 2 stool softeners and drink plenty of water.     lidocaine-prilocaine cream  Commonly known as:  EMLA  Apply a quarter size amount to port site 1 hour prior to chemo. Do not rub in. Cover with plastic wrap.     metoCLOPramide 5 MG  tablet  Commonly known as:  REGLAN  Take 1 tablet (5 mg total) by mouth 4 (four) times daily as needed for nausea.     naproxen sodium 220 MG tablet  Commonly known as:  ANAPROX  Take 220-440 mg by mouth 2 (two) times daily as needed (Pain).     OxyCODONE HCl ER 30 MG T12a  Take 1 tablet every 12 hours.     Oxycodone HCl 10 MG Tabs  Take 1 tablet every 4-6 hours as needed for breakthrough pain.     polyethylene glycol packet  Commonly known as:  MIRALAX / GLYCOLAX  Take 17 g by mouth daily.     predniSONE 20 MG tablet  Commonly known as:  DELTASONE  On Days 1-5 of chemo, take 4 tablets daily with a meal.     prochlorperazine 10 MG tablet  Commonly known as:  COMPAZINE  Take 1 tablet (10 mg total) by mouth 4 (four) times daily as needed for nausea or vomiting.     SENNA LAXATIVE PO  Take 100 mg by mouth. Takes 2 tabs at a times       Please HPI for pertinent positives, otherwise complete 10 system ROS negative.  Physical Exam: BP 146/84  Pulse 99  Temp(Src) 97.5 F (36.4 C) (Oral)  SpO2 98% There is no weight on file to calculate BMI.  General Appearance:  Alert, cooperative, no distress  Head:  Normocephalic, without obvious abnormality, atraumatic  Neck: Supple, symmetrical, trachea midline  Lungs:   Clear to auscultation bilaterally, no w/r/r, respirations unlabored without use of accessory muscles.  Chest Wall:  No tenderness or deformity  Heart:  Regular rate and rhythm, S1, S2 normal, no murmur, rub or gallop.  Abdomen:   Soft, non-tender, non distended, (+) BS  Extremities: RLE edema 1+, atraumatic, no cyanosis, no edema LLE  Neurologic: Normal affect, no gross deficits.   Results for orders placed during the hospital encounter of 04/27/13 (from the past 48 hour(s))  CBC     Status: Abnormal   Collection Time    04/27/13 12:50 PM      Result Value Ref Range   WBC 11.1 (*) 4.0 - 10.5 K/uL   RBC 4.49  4.22 - 5.81 MIL/uL   Hemoglobin 12.1 (*) 13.0 - 17.0  g/dL   HCT 38.0 (*) 39.0 - 52.0 %   MCV 84.6  78.0 - 100.0 fL   MCH 26.9  26.0 - 34.0 pg   MCHC 31.8  30.0 - 36.0 g/dL   RDW 14.9  11.5 - 15.5 %   Platelets 441 (*) 150 - 400 K/uL   Nm Cardiac Muga Rest  04/26/2013   CLINICAL DATA:  Malignant large cell lymphoma, baseline prior to cardiotoxic chemotherapy  EXAM: NUCLEAR MEDICINE CARDIAC BLOOD POOL IMAGING (MUGA)  TECHNIQUE: Cardiac multi-gated acquisition was performed at rest following intravenous injection of Tc-72mlabeled red blood cells.  RADIOPHARMACEUTICALS:  25MILLI CURIE ULTRATAG TECHNETIUM TC 13M-LABELED RED BLOOD CELLS IV KIT  COMPARISON:  None  FINDINGS: Calculated left ventricular ejection fraction is 64%, normal.  This was obtained at a cardiac rate of 93 beats per min.  Wall motion analysis of the left ventricle in 3 projections is normal.  IMPRESSION: Normal left ventricular ejection fraction of 64%.  Normal LV wall motion.   Electronically Signed   By: MLavonia DanaM.D.   On: 04/26/2013 15:05    Assessment/Plan Large B cell lymphoma s/p right axillary LN biopsy. Request for image guided port-a-catheter placement. Patient has been NPO, no blood thinners, afebrile, labs reviewed. Risks and Benefits discussed with the patient. All of the patient's questions were answered, patient is agreeable to proceed. Consent signed and in chart.   MTsosie BillingD PA-C 04/27/2013, 1:09 PM

## 2013-04-27 NOTE — Discharge Instructions (Signed)
Implanted Port Home Guide °An implanted port is a type of central line that is placed under the skin. Central lines are used to provide IV access when treatment or nutrition needs to be given through a person's veins. Implanted ports are used for long-term IV access. An implanted port may be placed because:  °· You need IV medicine that would be irritating to the small veins in your hands or arms.   °· You need long-term IV medicines, such as antibiotics.   °· You need IV nutrition for a long period.   °· You need frequent blood draws for lab tests.   °· You need dialysis.   °Implanted ports are usually placed in the chest area, but they can also be placed in the upper arm, the abdomen, or the leg. An implanted port has two main parts:  °· Reservoir. The reservoir is round and will appear as a small, raised area under your skin. The reservoir is the part where a needle is inserted to give medicines or draw blood.   °· Catheter. The catheter is a thin, flexible tube that extends from the reservoir. The catheter is placed into a large vein. Medicine that is inserted into the reservoir goes into the catheter and then into the vein.   °HOW WILL I CARE FOR MY INCISION SITE? °Do not get the incision site wet. Bathe or shower as directed by your health care provider.  °HOW IS MY PORT ACCESSED? °Special steps must be taken to access the port:  °· Before the port is accessed, a numbing cream can be placed on the skin. This helps numb the skin over the port site.   °· Your health care provider uses a sterile technique to access the port. °· Your health care provider must put on a mask and sterile gloves. °· The skin over your port is cleaned carefully with an antiseptic and allowed to dry. °· The port is gently pinched between sterile gloves, and a needle is inserted into the port. °· Only "non-coring" port needles should be used to access the port. Once the port is accessed, a blood return should be checked. This helps  ensure that the port is in the vein and is not clogged.   °· If your port needs to remain accessed for a constant infusion, a clear (transparent) bandage will be placed over the needle site. The bandage and needle will need to be changed every week, or as directed by your health care provider.   °· Keep the bandage covering the needle clean and dry. Do not get it wet. Follow your health care provider's instructions on how to take a shower or bath while the port is accessed.   °· If your port does not need to stay accessed, no bandage is needed over the port.   °WHAT IS FLUSHING? °Flushing helps keep the port from getting clogged. Follow your health care provider's instructions on how and when to flush the port. Ports are usually flushed with saline solution or a medicine called heparin. The need for flushing will depend on how the port is used.  °· If the port is used for intermittent medicines or blood draws, the port will need to be flushed:   °· After medicines have been given.   °· After blood has been drawn.   °· As part of routine maintenance.   °· If a constant infusion is running, the port may not need to be flushed.   °HOW LONG WILL MY PORT STAY IMPLANTED? °The port can stay in for as long as your health care   provider thinks it is needed. When it is time for the port to come out, surgery will be done to remove it. The procedure is similar to the one performed when the port was put in.  °WHEN SHOULD I SEEK IMMEDIATE MEDICAL CARE? °When you have an implanted port, you should seek immediate medical care if:  °· You notice a bad smell coming from the incision site.   °· You have swelling, redness, or drainage at the incision site.   °· You have more swelling or pain at the port site or the surrounding area.   °· You have a fever that is not controlled with medicine. °Document Released: 01/18/2005 Document Revised: 11/08/2012 Document Reviewed: 09/25/2012 °ExitCare® Patient Information ©2014 [employer]  [employer]. °Moderate Sedation, Adult °Moderate sedation is given to help you relax or even sleep through a procedure. You may remain sleepy, be clumsy, or have poor balance for several hours following this procedure. Arrange for a responsible adult, family member, or friend to take you home. A responsible adult should stay with you for at least 24 hours or until the medicines have worn off. °· Do not participate in any activities where you could become injured for the next 24 hours, or until you feel normal again. Do not: °· Drive. °· Swim. °· Ride a bicycle. °· Operate heavy machinery. °· Cook. °· Use power tools. °· Climb ladders. °· Work at heights. °· Do not make important decisions or sign legal documents until you are improved. °· Vomiting may occur if you eat too soon. When you can drink without vomiting, try water, juice, or soup. Try solid foods if you feel little or no nausea. °· Only take over-the-counter or prescription medications for pain, discomfort, or fever as directed by your caregiver.If pain medications have been prescribed for you, ask your caregiver how soon it is safe to take them. °· Make sure you and your family fully understands everything about the medication given to you. Make sure you understand what side effects may occur. °· You should not drink alcohol, take sleeping pills, or medications that cause drowsiness for at least 24 hours. °· If you smoke, do not smoke alone. °· If you are feeling better, you may resume normal activities 24 hours after receiving sedation. °· Keep all appointments as scheduled. Follow all instructions. °· Ask questions if you do not understand. °SEEK MEDICAL CARE IF:  °· Your skin is pale or bluish in color. °· You continue to feel sick to your stomach (nauseous) or throw up (vomit). °· Your pain is getting worse and not helped by medication. °· You have bleeding or swelling. °· You are still sleepy or feeling clumsy after 24 hours. °SEEK IMMEDIATE MEDICAL CARE IF:   °· You develop a rash. °· You have difficulty breathing. °· You develop any type of allergic problem. °· You have a fever. °Document Released: 10/13/2000 Document Revised: 04/12/2011 Document Reviewed: 09/25/2012 °ExitCare® Patient Information ©2014 [employer]. ° °

## 2013-04-30 ENCOUNTER — Encounter (HOSPITAL_BASED_OUTPATIENT_CLINIC_OR_DEPARTMENT_OTHER): Payer: Medicare Other

## 2013-04-30 VITALS — BP 107/65 | HR 82 | Temp 98.1°F | Resp 16 | Wt 170.2 lb

## 2013-04-30 DIAGNOSIS — C8589 Other specified types of non-Hodgkin lymphoma, extranodal and solid organ sites: Secondary | ICD-10-CM

## 2013-04-30 DIAGNOSIS — Z5111 Encounter for antineoplastic chemotherapy: Secondary | ICD-10-CM

## 2013-04-30 DIAGNOSIS — C8580 Other specified types of non-Hodgkin lymphoma, unspecified site: Secondary | ICD-10-CM

## 2013-04-30 DIAGNOSIS — Z5112 Encounter for antineoplastic immunotherapy: Secondary | ICD-10-CM

## 2013-04-30 MED ORDER — ACETAMINOPHEN 325 MG PO TABS
ORAL_TABLET | ORAL | Status: AC
  Filled 2013-04-30: qty 2

## 2013-04-30 MED ORDER — VINCRISTINE SULFATE CHEMO INJECTION 1 MG/ML
1.0000 mg | Freq: Once | INTRAVENOUS | Status: AC
  Administered 2013-04-30: 1 mg via INTRAVENOUS
  Filled 2013-04-30: qty 1

## 2013-04-30 MED ORDER — SODIUM CHLORIDE 0.9 % IV SOLN
Freq: Once | INTRAVENOUS | Status: AC
  Administered 2013-04-30: 10:00:00 via INTRAVENOUS

## 2013-04-30 MED ORDER — HEPARIN SOD (PORK) LOCK FLUSH 100 UNIT/ML IV SOLN
500.0000 [IU] | Freq: Once | INTRAVENOUS | Status: AC | PRN
  Administered 2013-04-30: 500 [IU]
  Filled 2013-04-30: qty 5

## 2013-04-30 MED ORDER — SODIUM CHLORIDE 0.9 % IJ SOLN
10.0000 mL | INTRAMUSCULAR | Status: DC | PRN
  Administered 2013-04-30: 10 mL

## 2013-04-30 MED ORDER — SODIUM CHLORIDE 0.9 % IV SOLN
6.0000 mg | Freq: Once | INTRAVENOUS | Status: AC
  Administered 2013-04-30: 6 mg via INTRAVENOUS
  Filled 2013-04-30: qty 4

## 2013-04-30 MED ORDER — SODIUM CHLORIDE 0.9 % IV SOLN: 16.0000 mg | Freq: Once | INTRAVENOUS | Status: DC

## 2013-04-30 MED ORDER — DEXAMETHASONE SODIUM PHOSPHATE 10 MG/ML IJ SOLN: 20.0000 mg | Freq: Once | INTRAMUSCULAR | Status: DC

## 2013-04-30 MED ORDER — RITUXIMAB CHEMO INJECTION 10 MG/ML
375.0000 mg/m2 | Freq: Once | INTRAVENOUS | Status: AC
  Administered 2013-04-30: 700 mg via INTRAVENOUS
  Filled 2013-04-30: qty 50

## 2013-04-30 MED ORDER — ACETAMINOPHEN 325 MG PO TABS
650.0000 mg | ORAL_TABLET | Freq: Once | ORAL | Status: AC
  Administered 2013-04-30: 650 mg via ORAL

## 2013-04-30 MED ORDER — DOXORUBICIN HCL CHEMO IV INJECTION 2 MG/ML
50.0000 mg/m2 | Freq: Once | INTRAVENOUS | Status: AC
  Administered 2013-04-30: 96 mg via INTRAVENOUS
  Filled 2013-04-30: qty 48

## 2013-04-30 MED ORDER — SODIUM CHLORIDE 0.9 % IV SOLN
Freq: Once | INTRAVENOUS | Status: AC
  Administered 2013-04-30: 16 mg via INTRAVENOUS
  Filled 2013-04-30: qty 8

## 2013-04-30 MED ORDER — SODIUM CHLORIDE 0.9 % IV SOLN
750.0000 mg/m2 | Freq: Once | INTRAVENOUS | Status: AC
  Administered 2013-04-30: 1420 mg via INTRAVENOUS
  Filled 2013-04-30: qty 25

## 2013-04-30 NOTE — Progress Notes (Signed)
Patient reports he took Tylenol and Benadryl as instructed apx 915 am today.

## 2013-04-30 NOTE — Patient Instructions (Signed)
[facility] Discharge Instructions for Patients Receiving Chemotherapy  Today you received the following chemotherapy agents Cycle 1 Day 1 Doxorubicin, Vincristine, Cytoxan, and Rituxan. Return tomorrow as scheduled for Day 2. Dr.Formanek recommends pain medication as follows: Oxycontin ER 30 mg tablets, take 2 tablets every 8 hours. Only take Oxycodone 10 mg tablet as needed for breakthrough pain. Take when you first start to encounter an increase in pain, do not wait to see how long you can go before taking it. If this regimen does not work for you, (ie: it makes you too sleepy, it doesn't control your pain) please let us know and we can adjust it accordingly. Increase fluid intake over the next few days. To help prevent nausea and vomiting after your treatment, we encourage you to take your nausea medication as instructed. Report any issues/concerns as needed. Keep all appointments as scheduled. If you develop nausea and vomiting that is not controlled by your nausea medication, call the clinic. If it is after clinic hours your family physician or the after hours number for the clinic or go to the Emergency Department.   BELOW ARE SYMPTOMS THAT SHOULD BE REPORTED IMMEDIATELY:  *FEVER GREATER THAN 101.0 F  *CHILLS WITH OR WITHOUT FEVER  NAUSEA AND VOMITING THAT IS NOT CONTROLLED WITH YOUR NAUSEA MEDICATION  *UNUSUAL SHORTNESS OF BREATH  *UNUSUAL BRUISING OR BLEEDING  TENDERNESS IN MOUTH AND THROAT WITH OR WITHOUT PRESENCE OF ULCERS  *URINARY PROBLEMS  *BOWEL PROBLEMS  UNUSUAL RASH Items with * indicate a potential emergency and should be followed up as soon as possible.  One of the nurses will contact you 24 hours after your treatment. Please let the nurse know about any problems that you may have experienced. Feel free to call the clinic you have any questions or concerns. The clinic phone number is [phone].   I have been informed and understand all the  instructions given to me. I know to contact the clinic, my physician, or go to the Emergency Department if any problems should occur. I do not have any questions at this time, but understand that I may call the clinic during office hours or the Patient Navigator at [phone] should I have any questions or need assistance in obtaining follow up care.    __________________________________________  _____________  __________ Signature of Patient or Authorized Representative            Date                   Time    __________________________________________ Nurse's Signature

## 2013-05-01 ENCOUNTER — Encounter (HOSPITAL_COMMUNITY): Payer: Medicare Other

## 2013-05-01 ENCOUNTER — Encounter (HOSPITAL_BASED_OUTPATIENT_CLINIC_OR_DEPARTMENT_OTHER): Payer: Medicare Other

## 2013-05-01 VITALS — BP 128/68 | HR 87 | Temp 97.5°F | Resp 16

## 2013-05-01 DIAGNOSIS — C8589 Other specified types of non-Hodgkin lymphoma, extranodal and solid organ sites: Secondary | ICD-10-CM

## 2013-05-01 DIAGNOSIS — C8580 Other specified types of non-Hodgkin lymphoma, unspecified site: Secondary | ICD-10-CM

## 2013-05-01 LAB — BASIC METABOLIC PANEL
BUN: 17 mg/dL (ref 6–23)
CALCIUM: 9 mg/dL (ref 8.4–10.5)
CO2: 29 mEq/L (ref 19–32)
Chloride: 98 mEq/L (ref 96–112)
Creatinine, Ser: 0.63 mg/dL (ref 0.50–1.35)
GFR calc non Af Amer: >90 mL/min (ref >90)
GLUCOSE: 107 mg/dL — AB (ref 70–99)
POTASSIUM: 4.1 meq/L (ref 3.7–5.3)
SODIUM: 138 meq/L (ref 137–147)

## 2013-05-01 LAB — URIC ACID: Uric Acid, Serum: 0.4 mg/dL — ABNORMAL LOW (ref 4.0–7.8)

## 2013-05-01 MED ORDER — SODIUM CHLORIDE 0.9 % IV SOLN
6.0000 mg | Freq: Once | INTRAVENOUS | Status: AC
  Administered 2013-05-01: 6 mg via INTRAVENOUS
  Filled 2013-05-01: qty 4

## 2013-05-01 MED ORDER — PEGFILGRASTIM INJECTION 6 MG/0.6ML
6.0000 mg | Freq: Once | SUBCUTANEOUS | Status: AC
  Administered 2013-05-01: 6 mg via SUBCUTANEOUS

## 2013-05-01 MED ORDER — SODIUM CHLORIDE 0.9 % IV SOLN
INTRAVENOUS | Status: DC
  Administered 2013-05-01: 13:00:00 via INTRAVENOUS

## 2013-05-01 MED ORDER — PEGFILGRASTIM INJECTION 6 MG/0.6ML
SUBCUTANEOUS | Status: AC
  Filled 2013-05-01: qty 0.6

## 2013-05-01 MED ORDER — HEPARIN SOD (PORK) LOCK FLUSH 100 UNIT/ML IV SOLN
500.0000 [IU] | Freq: Once | INTRAVENOUS | Status: AC
  Administered 2013-05-01: 500 [IU] via INTRAVENOUS
  Filled 2013-05-01: qty 5

## 2013-05-01 MED ORDER — SODIUM CHLORIDE 0.9 % IJ SOLN
10.0000 mL | INTRAMUSCULAR | Status: DC | PRN
  Administered 2013-05-01: 10 mL via INTRAVENOUS

## 2013-05-01 NOTE — Progress Notes (Signed)
Devin Zuniga presents today for injection per MD orders. Neulasta 6mg administered SQ in left Abdomen. Administration without incident. Patient tolerated well.  

## 2013-05-02 ENCOUNTER — Other Ambulatory Visit (HOSPITAL_COMMUNITY): Payer: Medicare Other

## 2013-05-02 ENCOUNTER — Other Ambulatory Visit (HOSPITAL_COMMUNITY): Payer: Self-pay | Admitting: *Deleted

## 2013-05-02 ENCOUNTER — Telehealth (HOSPITAL_COMMUNITY): Payer: Self-pay | Admitting: *Deleted

## 2013-05-02 DIAGNOSIS — C8580 Other specified types of non-Hodgkin lymphoma, unspecified site: Secondary | ICD-10-CM

## 2013-05-02 MED ORDER — ONDANSETRON 8 MG PO TBDP: 8.0000 mg | ORAL_TABLET | Freq: Three times a day (TID) | ORAL | Status: AC | PRN

## 2013-05-02 NOTE — Telephone Encounter (Signed)
Pt is doing ok today but is having pain in his posterior left shoulder. This is new pain. I told his daughter that this could potentially be coming from Neulasta. Patient states that he feels like he has the worse flu ever (achy all over). I told the patient's daughter Anderson Malta to buy a thermometer a keep a check on his temperature. Pt doesn't feel like eating or drinking much. Daughter is encouraging food and liquids. Daughter states that pt is getting hoarse. I told her that sometimes this happens with chemo. I have scheduled him tomoro 4/2 @ 10:30 to see Tom.

## 2013-05-02 NOTE — Progress Notes (Signed)
05/01/13 at 1320 patient denies any complaints post chemo. Reports he took 1 oxycontin at about 1030 last pm and slept all night. Reports he did not take another oxycontin until almost 11 am today. Reports he feels well compared to yesterday.

## 2013-05-03 ENCOUNTER — Encounter (HOSPITAL_COMMUNITY): Payer: Self-pay | Admitting: Oncology

## 2013-05-03 ENCOUNTER — Encounter (HOSPITAL_COMMUNITY): Payer: Medicare Other | Attending: Hematology and Oncology | Admitting: Oncology

## 2013-05-03 VITALS — BP 103/57 | HR 80 | Temp 97.8°F | Resp 16 | Wt 165.4 lb

## 2013-05-03 DIAGNOSIS — C8580 Other specified types of non-Hodgkin lymphoma, unspecified site: Secondary | ICD-10-CM

## 2013-05-03 DIAGNOSIS — C8589 Other specified types of non-Hodgkin lymphoma, extranodal and solid organ sites: Secondary | ICD-10-CM | POA: Insufficient documentation

## 2013-05-03 NOTE — Patient Instructions (Signed)
[facility] Discharge Instructions  RECOMMENDATIONS MADE BY THE CONSULTANT AND ANY TEST RESULTS WILL BE SENT TO YOUR REFERRING PHYSICIAN.  MEDICATIONS PRESCRIBED:  None  INSTRUCTIONS GIVEN AND DISCUSSED: Anti-emetic regimen instructions provided.  All anti-emetics are mutually exclusive and can be taken as ordered as needed.   SPECIAL INSTRUCTIONS/FOLLOW-UP: Please call the [facility] [phone]) with any questions, concerns, or issues.  The [facility] Emergency Department is always available to you if needed.   Thank you for choosing [facility] to provide your oncology and hematology care.  To afford each patient quality time with our providers, please arrive at least 15 minutes before your scheduled appointment time.  With your help, our goal is to use those 15 minutes to complete the necessary work-up to ensure our physicians have the information they need to help with your evaluation and healthcare recommendations.    Effective January 1st, 2014, we ask that you re-schedule your appointment with our physicians should you arrive 10 or more minutes late for your appointment.  We strive to give you quality time with our providers, and arriving late affects you and other patients whose appointments are after yours.    Again, thank you for choosing [facility].  Our hope is that these requests will decrease the amount of time that you wait before being seen by our physicians.       _____________________________________________________________  Should you have questions after your visit to [facility], please contact our office at [phone] between the hours of 8:30 a.m. and 5:00 p.m.  Voicemails left after 4:30 p.m. will not be returned until the following business day.  For prescription refill requests, have your pharmacy contact our office with your prescription refill request.

## 2013-05-03 NOTE — Progress Notes (Signed)
Devin Zuniga is seen as a work-in today.  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 Femoral involvement with disease has been documented as well.  He is Day 4 of R-CHOP with Neulasta support on Day 2.  He received Day 1 and Day 2 of Rasburicase for hyperuricemia and to prevent tumor lysis syndrome.   On 4/1, the patient reported posterior left shoulder pain (new) and flu-like symptoms with diffuse achiness.  He was encouraged to purchase a thermometer and document temperatures.  He was educated to report fevers and shaking chills to [place] and report to ED after business hours.  There was also concern about his oral intake (food and H2O).  As a result of these issues, the patient was made an appointment for today, 4/2.  During conversation today I learned that today's visit is simply to answer a few questions regarding proper protocol interventions for side effects of chemotherapy:  1. Anti-emetic-  He has Reglan, Zofran ODT, and Compazine at home.  These medications are mutually exclusive and they can be taken according to their directions as needed.    2. Pain- he reports that his pain is much improved and questions whether he can decrease his long-acting pain medication.  I think when he returns in a few days, he can go to once daily x 7 days and is feeling well, he can stop the long-acting pain medication and use short acting pain medications as needed.  This decrease in pain is in response to therapy.  3. Signs and symptoms to report to [place] or ED- Fevers 100.38F or greater, shaking chills, intractable nausea/vomiting, extreme pain that is uncontrolled at home, extreme weakness/fatigue, extreme diarrhea, etc.  I provided the patient education regarding chemotherapy regimen, side effects, and toxicities.    Patient and plan discussed with Dr. Farrel Gobble and he is in agreement with the  aforementioned.    I spent 25 minutes in face to face contact with the patient and his daughter and 35 minutes total  KEFALAS,THOMAS 05/03/2013

## 2013-05-07 ENCOUNTER — Telehealth (HOSPITAL_COMMUNITY): Payer: Self-pay | Admitting: *Deleted

## 2013-05-07 ENCOUNTER — Ambulatory Visit (HOSPITAL_COMMUNITY): Payer: Medicare Other

## 2013-05-07 NOTE — Telephone Encounter (Signed)
Patient is extremely tired today. He started "dragging" yesterday evening (Sunday). He is so tired today that he can hardly walk through the house - per daughter. This is what he told his daughter. Appetite is less than 25% today as compared to 75% on Sunday. Patient likes eating "fruit cups" and dropped it twice this am. He has no energy.   Patient's rt leg is not as swollen per daughter. Patient said patient is not having pain. No fever and chills.

## 2013-05-08 ENCOUNTER — Encounter (HOSPITAL_COMMUNITY): Payer: Medicare Other

## 2013-05-08 ENCOUNTER — Encounter (HOSPITAL_BASED_OUTPATIENT_CLINIC_OR_DEPARTMENT_OTHER): Payer: Medicare Other

## 2013-05-08 ENCOUNTER — Encounter (HOSPITAL_COMMUNITY): Payer: Self-pay

## 2013-05-08 VITALS — BP 108/70 | HR 97 | Temp 98.1°F | Resp 18 | Wt 150.5 lb

## 2013-05-08 DIAGNOSIS — T451X5A Adverse effect of antineoplastic and immunosuppressive drugs, initial encounter: Secondary | ICD-10-CM

## 2013-05-08 DIAGNOSIS — D61810 Antineoplastic chemotherapy induced pancytopenia: Secondary | ICD-10-CM

## 2013-05-08 DIAGNOSIS — J449 Chronic obstructive pulmonary disease, unspecified: Secondary | ICD-10-CM

## 2013-05-08 DIAGNOSIS — C8580 Other specified types of non-Hodgkin lymphoma, unspecified site: Secondary | ICD-10-CM

## 2013-05-08 DIAGNOSIS — C8589 Other specified types of non-Hodgkin lymphoma, extranodal and solid organ sites: Secondary | ICD-10-CM

## 2013-05-08 LAB — CBC WITH DIFFERENTIAL/PLATELET
BASOS PCT: 2 % — AB (ref 0–1)
Basophils Absolute: 0.1 10*3/uL (ref 0.0–0.1)
EOS ABS: 0.1 10*3/uL (ref 0.0–0.7)
EOS PCT: 2 % (ref 0–5)
HCT: 33.1 % — ABNORMAL LOW (ref 39.0–52.0)
HEMOGLOBIN: 10.5 g/dL — AB (ref 13.0–17.0)
Lymphocytes Relative: 41 % (ref 12–46)
Lymphs Abs: 1.4 10*3/uL (ref 0.7–4.0)
MCH: 26.6 pg (ref 26.0–34.0)
MCHC: 31.7 g/dL (ref 30.0–36.0)
MCV: 84 fL (ref 78.0–100.0)
MONO ABS: 0.5 10*3/uL (ref 0.1–1.0)
Monocytes Relative: 14 % — ABNORMAL HIGH (ref 3–12)
NEUTROS PCT: 41 % — AB (ref 43–77)
Neutro Abs: 1.3 10*3/uL — ABNORMAL LOW (ref 1.7–7.7)
PLATELETS: 136 10*3/uL — AB (ref 150–400)
RBC: 3.94 MIL/uL — AB (ref 4.22–5.81)
RDW: 14.9 % (ref 11.5–15.5)
WBC: 3.4 10*3/uL — ABNORMAL LOW (ref 4.0–10.5)

## 2013-05-08 LAB — BASIC METABOLIC PANEL
BUN: 12 mg/dL (ref 6–23)
CALCIUM: 9.1 mg/dL (ref 8.4–10.5)
CO2: 28 mEq/L (ref 19–32)
Chloride: 102 mEq/L (ref 96–112)
Creatinine, Ser: 0.61 mg/dL (ref 0.50–1.35)
GFR calc Af Amer: >90 mL/min (ref >90)
Glucose, Bld: 108 mg/dL — ABNORMAL HIGH (ref 70–99)
POTASSIUM: 3.9 meq/L (ref 3.7–5.3)
Sodium: 142 mEq/L (ref 137–147)

## 2013-05-08 LAB — URIC ACID: URIC ACID, SERUM: 3.1 mg/dL — AB (ref 4.0–7.8)

## 2013-05-08 NOTE — Progress Notes (Signed)
[place]  OFFICE PROGRESS NOTE  [name], MD [address] [place] [address]  DIAGNOSIS: Lymphoma malignant, large cell - Plan: CBC with Differential, CBC with Differential, CANCELED: Basic metabolic panel  Chief Complaint  Patient presents with  . Lymphoma    R. CHOP #1 one week ago    CURRENT THERAPY: R-CHOP cycle one one week ago followed by Neulasta with Rasburicase protection on day 1 and 2. He did take prednisone for 5 days.    INTERVAL HISTORY: Jefrey Raburn 68 y.o. male returns for followup of diffuse large B-cell lymphoma, stage IV, status post 1 cycle of R.-CHOP plus Neulasta with Rasburicase prophylaxis. ; Right lower extremity swelling has decreased by 50%. He is urinating a lot with foul-smelling urine. His analgesic requirement has diminished dramatically down to 1 OxyContin 30 mg daily. He denies any fever, night sweats, but has developed some reflux symptoms. He is drinking a lot of water. He denies any sore throat, cough, wheezing, but still has residual discomfort involving the right thigh and right groin. He denies any dysuria, hematuria, incontinence, and is taking two Senna and 2 stool softeners daily. He denies any cough, shortness of breath, PND, orthopnea, palpitations, or peripheral paresthesias.  MEDICAL HISTORY: Past Medical History  Diagnosis Date  . Cancer   . Diffuse large B cell lymphoma 04/24/2013    INTERIM HISTORY: has Piriformis syndrome of right side; Right hip pain; Back pain; Lymphadenopathy, generalized; and Diffuse large B cell lymphoma on his problem list.    ALLERGIES:  has No Known Allergies.  MEDICATIONS: has a current medication list which includes the following prescription(s): allopurinol, capsaicin, hydrocodone-acetaminophen, lidocaine-prilocaine, liniments, metoclopramide, naproxen sodium, ondansetron, oxycodone hcl, oxycodone hcl er, polyethylene glycol, prednisone,  prochlorperazine, and sennosides.  SURGICAL HISTORY:  Past Surgical History  Procedure Laterality Date  . Portacath placement Right 04/27/13    FAMILY HISTORY: family history includes Cancer in his brother.  SOCIAL HISTORY:  reports that he has been smoking Cigarettes.  He has been smoking about 0.25 packs per day. He has never used smokeless tobacco. He reports that he does not drink alcohol or use illicit drugs.  REVIEW OF SYSTEMS:  Other than that discussed above is noncontributory.  PHYSICAL EXAMINATION: ECOG PERFORMANCE STATUS: 1 - Symptomatic but completely ambulatory  Blood pressure 108/70, pulse 97, temperature 98.1 F (36.7 C), temperature source Oral, resp. rate 18, weight 150 lb 8 oz (68.266 kg), SpO2 99.00%.  GENERAL:alert, no distress and comfortable SKIN: skin color, texture, turgor are normal, no rashes or significant lesions EYES: PERLA; Conjunctiva are pink and non-injected, sclera clear SINUSES: No redness or tenderness over maxillary or ethmoid sinuses OROPHARYNX:no exudate, no erythema on lips, buccal mucosa, or tongue. NECK: supple, thyroid normal size, non-tender, without nodularity. No masses CHEST: LifePort in place. No gynecomastia. Increased AP diameter. LYMPH: Previous he noted right axillary lymph nodes are nearly gone. LUNGS: clear to auscultation and percussion with normal breathing effort HEART: regular rate & rhythm and no murmurs. ABDOMEN:abdomen soft, non-tender and normal bowel sounds MUSCULOSKELETAL:no cyanosis of digits and no clubbing. Range of motion normal. Right lower extremity swelling one and half times of the left lower extremity with tenderness over the right groin and mid right femur. NEURO: alert & oriented x 3 with fluent speech, no focal motor/sensory deficits   LABORATORY DATA: Appointment on 05/08/2013  Component Date Value Ref Range Status  . WBC 05/08/2013 3.4* 4.0 -  10.5 K/uL Final  . RBC 05/08/2013 3.94* 4.22 - 5.81 MIL/uL  Final  . Hemoglobin 05/08/2013 10.5* 13.0 - 17.0 g/dL Final  . HCT 05/08/2013 33.1* 39.0 - 52.0 % Final  . MCV 05/08/2013 84.0  78.0 - 100.0 fL Final  . MCH 05/08/2013 26.6  26.0 - 34.0 pg Final  . MCHC 05/08/2013 31.7  30.0 - 36.0 g/dL Final  . RDW 05/08/2013 14.9  11.5 - 15.5 % Final  . Platelets 05/08/2013 136* 150 - 400 K/uL Final  . Neutrophils Relative % 05/08/2013 41* 43 - 77 % Final  . Lymphocytes Relative 05/08/2013 41  12 - 46 % Final  . Monocytes Relative 05/08/2013 14* 3 - 12 % Final  . Eosinophils Relative 05/08/2013 2  0 - 5 % Final  . Basophils Relative 05/08/2013 2* 0 - 1 % Final  . Neutro Abs 05/08/2013 1.3* 1.7 - 7.7 K/uL Final  . Lymphs Abs 05/08/2013 1.4  0.7 - 4.0 K/uL Final  . Monocytes Absolute 05/08/2013 0.5  0.1 - 1.0 K/uL Final  . Eosinophils Absolute 05/08/2013 0.1  0.0 - 0.7 K/uL Final  . Basophils Absolute 05/08/2013 0.1  0.0 - 0.1 K/uL Final  . WBC Morphology 05/08/2013 MILD LEFT SHIFT (1-5% METAS, OCC MYELO, OCC BANDS)   Final   ATYPICAL LYMPHOCYTES  . Sodium 05/08/2013 142  137 - 147 mEq/L Final  . Potassium 05/08/2013 3.9  3.7 - 5.3 mEq/L Final  . Chloride 05/08/2013 102  96 - 112 mEq/L Final  . CO2 05/08/2013 28  19 - 32 mEq/L Final  . Glucose, Bld 05/08/2013 108* 70 - 99 mg/dL Final  . BUN 05/08/2013 12  6 - 23 mg/dL Final  . Creatinine, Ser 05/08/2013 0.61  0.50 - 1.35 mg/dL Final  . Calcium 05/08/2013 9.1  8.4 - 10.5 mg/dL Final  . GFR calc non Af Amer 05/08/2013 >90  >90 mL/min Final  . GFR calc Af Amer 05/08/2013 >90  >90 mL/min Final   Comment: (NOTE)                          The eGFR has been calculated using the CKD EPI equation.                          This calculation has not been validated in all clinical situations.                          eGFR's persistently <90 mL/min signify possible Chronic Kidney                          Disease.  Marland Kitchen Uric Acid, Serum 05/08/2013 3.1* 4.0 - 7.8 mg/dL Final  Infusion on 05/01/2013  Component Date  Value Ref Range Status  . Uric Acid, Serum 05/01/2013 0.4* 4.0 - 7.8 mg/dL Final  . Sodium 05/01/2013 138  137 - 147 mEq/L Final  . Potassium 05/01/2013 4.1  3.7 - 5.3 mEq/L Final  . Chloride 05/01/2013 98  96 - 112 mEq/L Final  . CO2 05/01/2013 29  19 - 32 mEq/L Final  . Glucose, Bld 05/01/2013 107* 70 - 99 mg/dL Final  . BUN 05/01/2013 17  6 - 23 mg/dL Final  . Creatinine, Ser 05/01/2013 0.63  0.50 - 1.35 mg/dL Final  . Calcium 05/01/2013 9.0  8.4 - 10.5 mg/dL Final  .  GFR calc non Af Amer 05/01/2013 >90  >90 mL/min Final  . GFR calc Af Amer 05/01/2013 >90  >90 mL/min Final   Comment: (NOTE)                          The eGFR has been calculated using the CKD EPI equation.                          This calculation has not been validated in all clinical situations.                          eGFR's persistently <90 mL/min signify possible Chronic Kidney                          Disease.  Hospital Outpatient Visit on 04/27/2013  Component Date Value Ref Range Status  . aPTT 04/27/2013 29  24 - 37 seconds Final  . WBC 04/27/2013 11.1* 4.0 - 10.5 K/uL Final  . RBC 04/27/2013 4.49  4.22 - 5.81 MIL/uL Final  . Hemoglobin 04/27/2013 12.1* 13.0 - 17.0 g/dL Final  . HCT 04/27/2013 38.0* 39.0 - 52.0 % Final  . MCV 04/27/2013 84.6  78.0 - 100.0 fL Final  . MCH 04/27/2013 26.9  26.0 - 34.0 pg Final  . MCHC 04/27/2013 31.8  30.0 - 36.0 g/dL Final  . RDW 04/27/2013 14.9  11.5 - 15.5 % Final  . Platelets 04/27/2013 441* 150 - 400 K/uL Final  . Prothrombin Time 04/27/2013 13.6  11.6 - 15.2 seconds Final  . INR 04/27/2013 1.06  0.00 - 1.49 Final  Office Visit on 04/26/2013  Component Date Value Ref Range Status  . Uric Acid, Serum 04/26/2013 4.8  4.0 - 7.8 mg/dL Final  . WBC 04/26/2013 10.1  4.0 - 10.5 K/uL Final  . RBC 04/26/2013 4.43  4.22 - 5.81 MIL/uL Final  . Hemoglobin 04/26/2013 12.2* 13.0 - 17.0 g/dL Final  . HCT 04/26/2013 38.0* 39.0 - 52.0 % Final  . MCV 04/26/2013 85.8  78.0 - 100.0 fL  Final  . MCH 04/26/2013 27.5  26.0 - 34.0 pg Final  . MCHC 04/26/2013 32.1  30.0 - 36.0 g/dL Final  . RDW 04/26/2013 14.8  11.5 - 15.5 % Final  . Platelets 04/26/2013 418* 150 - 400 K/uL Final  . Neutrophils Relative % 04/26/2013 76  43 - 77 % Final  . Neutro Abs 04/26/2013 7.7  1.7 - 7.7 K/uL Final  . Lymphocytes Relative 04/26/2013 18  12 - 46 % Final  . Lymphs Abs 04/26/2013 1.8  0.7 - 4.0 K/uL Final  . Monocytes Relative 04/26/2013 5  3 - 12 % Final  . Monocytes Absolute 04/26/2013 0.5  0.1 - 1.0 K/uL Final  . Eosinophils Relative 04/26/2013 1  0 - 5 % Final  . Eosinophils Absolute 04/26/2013 0.1  0.0 - 0.7 K/uL Final  . Basophils Relative 04/26/2013 0  0 - 1 % Final  . Basophils Absolute 04/26/2013 0.0  0.0 - 0.1 K/uL Final  . Retic Ct Pct 04/26/2013 1.3  0.4 - 3.1 % Final  . RBC. 04/26/2013 4.43  4.22 - 5.81 MIL/uL Final  . Retic Count, Manual 04/26/2013 57.6  19.0 - 186.0 K/uL Final  . Sodium 04/26/2013 142  137 - 147 mEq/L Final  . Potassium 04/26/2013 3.8  3.7 - 5.3 mEq/L Final  .  Chloride 04/26/2013 102  96 - 112 mEq/L Final  . CO2 04/26/2013 29  19 - 32 mEq/L Final  . Glucose, Bld 04/26/2013 128* 70 - 99 mg/dL Final  . BUN 04/26/2013 10  6 - 23 mg/dL Final  . Creatinine, Ser 04/26/2013 0.68  0.50 - 1.35 mg/dL Final  . Calcium 04/26/2013 9.3  8.4 - 10.5 mg/dL Final  . Total Protein 04/26/2013 7.1  6.0 - 8.3 g/dL Final  . Albumin 04/26/2013 2.9* 3.5 - 5.2 g/dL Final  . AST 04/26/2013 18  0 - 37 U/L Final  . ALT 04/26/2013 6  0 - 53 U/L Final  . Alkaline Phosphatase 04/26/2013 91  39 - 117 U/L Final  . Total Bilirubin 04/26/2013 0.3  0.3 - 1.2 mg/dL Final  . GFR calc non Af Amer 04/26/2013 >90  >90 mL/min Final  . GFR calc Af Amer 04/26/2013 >90  >90 mL/min Final   Comment: (NOTE)                          The eGFR has been calculated using the CKD EPI equation.                          This calculation has not been validated in all clinical situations.                           eGFR's persistently <90 mL/min signify possible Chronic Kidney                          Disease.  Infusion on 04/25/2013  Component Date Value Ref Range Status  . Hepatitis B Surface Ag 04/25/2013 NEGATIVE  NEGATIVE Final   Performed at [facility]  . Hep B C IgM 04/25/2013 NON REACTIVE  NON REACTIVE Final   Comment: (NOTE)                          High levels of Hepatitis B Core IgM antibody are detectable                          during the acute stage of Hepatitis B. This antibody is used                          to differentiate current from past HBV infection.                          Performed at [facility] Visit on 04/16/2013  Component Date Value Ref Range Status  . WBC 04/16/2013 11.8* 4.0 - 10.5 K/uL Final  . RBC 04/16/2013 4.78  4.22 - 5.81 MIL/uL Final  . Hemoglobin 04/16/2013 13.2  13.0 - 17.0 g/dL Final  . HCT 04/16/2013 41.8  39.0 - 52.0 % Final  . MCV 04/16/2013 87.4  78.0 - 100.0 fL Final  . MCH 04/16/2013 27.6  26.0 - 34.0 pg Final  . MCHC 04/16/2013 31.6  30.0 - 36.0 g/dL Final  . RDW 04/16/2013 15.0  11.5 - 15.5 % Final  . Platelets 04/16/2013 443* 150 - 400 K/uL Final  . Neutrophils Relative % 04/16/2013 78* 43 - 77 % Final  . Neutro Abs 04/16/2013 9.3* 1.7 -  7.7 K/uL Final  . Lymphocytes Relative 04/16/2013 13  12 - 46 % Final  . Lymphs Abs 04/16/2013 1.5  0.7 - 4.0 K/uL Final  . Monocytes Relative 04/16/2013 8  3 - 12 % Final  . Monocytes Absolute 04/16/2013 1.0  0.1 - 1.0 K/uL Final  . Eosinophils Relative 04/16/2013 0  0 - 5 % Final  . Eosinophils Absolute 04/16/2013 0.0  0.0 - 0.7 K/uL Final  . Basophils Relative 04/16/2013 0  0 - 1 % Final  . Basophils Absolute 04/16/2013 0.0  0.0 - 0.1 K/uL Final  . Sodium 04/16/2013 140  137 - 147 mEq/L Final  . Potassium 04/16/2013 4.3  3.7 - 5.3 mEq/L Final  . Chloride 04/16/2013 103  96 - 112 mEq/L Final  . CO2 04/16/2013 27  19 - 32 mEq/L Final  . Glucose, Bld 04/16/2013 92  70 - 99  mg/dL Final  . BUN 04/16/2013 11  6 - 23 mg/dL Final  . Creatinine, Ser 04/16/2013 0.71  0.50 - 1.35 mg/dL Final  . Calcium 04/16/2013 9.2  8.4 - 10.5 mg/dL Final  . Total Protein 04/16/2013 7.3  6.0 - 8.3 g/dL Final  . Albumin 04/16/2013 3.1* 3.5 - 5.2 g/dL Final  . AST 04/16/2013 14  0 - 37 U/L Final  . ALT 04/16/2013 6  0 - 53 U/L Final  . Alkaline Phosphatase 04/16/2013 95  39 - 117 U/L Final  . Total Bilirubin 04/16/2013 0.4  0.3 - 1.2 mg/dL Final  . GFR calc non Af Amer 04/16/2013 >90  >90 mL/min Final  . GFR calc Af Amer 04/16/2013 >90  >90 mL/min Final   Comment: (NOTE)                          The eGFR has been calculated using the CKD EPI equation.                          This calculation has not been validated in all clinical situations.                          eGFR's persistently <90 mL/min signify possible Chronic Kidney                          Disease.  Marland Kitchen LDH 04/16/2013 397* 94 - 250 U/L Final  . Beta-2 Microglobulin 04/16/2013 3.25* <=2.51 mg/L Final   Performed at [facility] Outpatient Visit on 04/12/2013  Component Date Value Ref Range Status  . Glucose-Capillary 04/12/2013 102* 70 - 99 mg/dL Final    PATHOLOGY: No new pathology.  Urinalysis No results found for this basename: colorurine,  appearanceur,  labspec,  phurine,  glucoseu,  hgbur,  bilirubinur,  ketonesur,  proteinur,  urobilinogen,  nitrite,  leukocytesur    RADIOGRAPHIC STUDIES: Nm Cardiac Muga Rest  04/26/2013   CLINICAL DATA:  Malignant large cell lymphoma, baseline prior to cardiotoxic chemotherapy  EXAM: NUCLEAR MEDICINE CARDIAC BLOOD POOL IMAGING (MUGA)  TECHNIQUE: Cardiac multi-gated acquisition was performed at rest following intravenous injection of Tc-56mlabeled red blood cells.  RADIOPHARMACEUTICALS:  25MILLI CURIE ULTRATAG TECHNETIUM TC 23M-LABELED RED BLOOD CELLS IV KIT  COMPARISON:  None  FINDINGS: Calculated left ventricular ejection fraction is 64%, normal.  This  was obtained at a cardiac rate of 93 beats per min.  Wall motion  analysis of the left ventricle in 3 projections is normal.  IMPRESSION: Normal left ventricular ejection fraction of 64%.  Normal LV wall motion.   Electronically Signed   By: Lavonia Dana M.D.   On: 04/26/2013 15:05   Nm Pet Image Initial (pi) Skull Base To Thigh  04/12/2013   CLINICAL DATA:  Initial treatment strategy for lymphadenopathy and right femur mass.  EXAM: NUCLEAR MEDICINE PET SKULL BASE TO THIGH  TECHNIQUE: 9.1 mCi F-18 FDG was injected intravenously. Full-ring PET imaging was performed from the skull base to thigh after the radiotracer. CT data was obtained and used for attenuation correction and anatomic localization.  FASTING BLOOD GLUCOSE:  Value: 102 mg/dl  COMPARISON:  CT abdomen pelvis dated 03/30/2013  FINDINGS: NECK  No hypermetabolic lymph nodes in the neck.  CHEST  Widespread thoracic nodal metastases, including:  --1.4 cm short axis right supraclavicular/ subpectoral node (series 4/ image 49), max SUV 10.5  --3.0 cm short axis right axillary node (series 4/image 57), max SUV 11.8  --4.5 x 3.7 cm AP window nodal mass, partially necrotic (series 4/image 70), max SUV 9.1  --2.2 cm short axis subcarinal node (series 4/image 81), max SUV 37.6  --hypermetabolic bilateral hilar nodes, difficult to measure on unenhanced CT, max SUV 9.3  Lungs are notable for moderate centrilobular emphysematous changes. No suspicious pulmonary nodules.  ABDOMEN/PELVIS  No abnormal hypermetabolic activity within the liver and bilateral adrenal glands. Possible 11 mm left adrenal nodule (series 4/image 117), without convincing hypermetabolism on PET.  Focal hypermetabolism with soft tissue prominence superiorly along the distal pancreatic body/ tail (series 4/image 114), max SUV 7.7. Although not well visualized on CT, this appearance is worrisome for primary pancreatic neoplasm or metastasis. Additional focus of hypermetabolism in the pancreatic head,  max SUV 8.7 (PET image 109), although without corresponding CT abnormality on recent CT.  Multifocal hypermetabolic splenic lesions, max SUV 9.0, compatible with metastases.  Multiple retroperitoneal/right pelvic lymph nodes, including  --1.7 cm short axis right common iliac node (series 4/ image 135), max SUV 12.1  -- 6.1 x 5.1 cm right external iliac nodal metastasis (series 4/ image 173), max SUV 13.3  SKELETON  Multifocal/widespread osseous metastases, including:  --right humeral head lesion without definite CT correlate, max SUV 8.7 (PET image 33), with additional hypermetabolism in the adjacent musculature  --suspected vague permeative lesion in the right femoral head, max SUV 12.7, with additional hypermetabolism in the adjacent musculature, particularly in the medial right thigh (series 4/image 203)  --additional hypermetabolic lesions in the left scapula, posterior left 12th rib, left posterior elements at L5, right iliac bone, and right sacrum  Multiple healing left lateral rib fractures, non FDG avid.  IMPRESSION: Widespread nodal metastases in the chest, abdomen, and pelvis, as described above. Consider percutaneous sampling of a dominant right axillary nodal for tissue characterization.  Additional hypermetabolic lesions in the pancreas and spleen, suspicious for metastases, although primary pancreatic neoplasm along the distal pancreatic body/tail is possible.  Multifocal osseous metastases, including dominant lesions in the right humeral head and right femoral head.  Multiple healing left rib fractures, non FDG avid.   Electronically Signed   By: Julian Hy M.D.   On: 04/12/2013 12:39   US Biopsy  04/18/2013   CLINICAL DATA:  68 year old with diffuse lymphadenopathy. Tissue diagnosis is needed.  EXAM: ULTRASOUND GUIDED CORE BIOPSY OF RIGHT AXILLARY LYMPH NODE  MEDICATIONS: None  Total Moderate Sedation Time: None  PROCEDURE: The procedure, risks, benefits, and alternatives  were explained to  the patient. Questions regarding the procedure were encouraged and answered. The patient understands and consents to the procedure.  The right axilla was prepped with Betadine and a sterile drape was placed. The skin was anesthetized with 1% lidocaine. Using ultrasound guidance, an 18 gauge core needle was directed into the right axillary lesion/ lymph node. A total of 4 core biopsies were obtained. The specimens were placed in saline.  COMPLICATIONS: None.  FINDINGS: There is a large heterogeneous structure in the right axilla consistent with an enlarged and abnormal lymph node. Core biopsy needle placement was confirmed within the lesion. No significant bleeding following the core biopsies.  IMPRESSION: Ultrasound-guided core biopsies of the enlarged right axillary lymph node.   Electronically Signed   By: Markus Daft M.D.   On: 04/18/2013 17:26   Ir Fluoro Guide Cv Line Right  04/27/2013   CLINICAL DATA:  68 year old male with lymphoma in need of durable central venous access for chemotherapy.  EXAM: IR RIGHT FLOURO GUIDE CV LINE; IR ULTRASOUND GUIDANCE VASC ACCESS RIGHT  Date: 04/27/2013  ANESTHESIA/SEDATION: Moderate (conscious) sedation was administered during this procedure. A total of 3 mg Versed and 100 mg Fentanyl were administered intravenously. The patient's vital signs were monitored continuously by radiology nursing throughout the course of the procedure.  Total sedation time: 15 minutes  FLUOROSCOPY TIME:  6 seconds  TECHNIQUE: The right neck and chest was prepped with chlorhexidine, and draped in the usual sterile fashion using maximum barrier technique (cap and mask, sterile gown, sterile gloves, large sterile sheet, hand hygiene and cutaneous antiseptic). Antibiotic prophylaxis was provided with 2g Ancef administered IV one hour prior to skin incision. Local anesthesia was attained by infiltration with 1% lidocaine with epinephrine.  Ultrasound demonstrated patency of the right internal jugular  vein, and this was documented with an image. Under real-time ultrasound guidance, this vein was accessed with a 21 gauge micropuncture needle and image documentation was performed. A small dermatotomy was made at the access site with an 11 scalpel. A 0.018" wire was advanced into the SVC and the access needle exchanged for a 24F micropuncture vascular sheath. The 0.018" wire was then removed and a 0.035" wire advanced into the IVC.  An appropriate location for the subcutaneous reservoir was selected below the clavicle and an incision was made through the skin and underlying soft tissues. The subcutaneous tissues were then dissected using a combination of blunt and sharp surgical technique and a pocket was formed. A single lumen power injectable portacatheter was then tunneled through the subcutaneous tissues from the pocket to the dermatotomy and the port reservoir placed within the subcutaneous pocket.  The venous access site was then serially dilated and a peel away vascular sheath placed over the wire. The wire was removed and the port catheter advanced into position under fluoroscopic guidance. The catheter tip is positioned in the superior cavoatrial junction. This was documented with a spot image. The portacatheter was then tested and found to flush and aspirate well. The port was flushed with saline followed by 100 units/mL heparinized saline.  The pocket was then closed in two layers using first subdermal inverted interrupted absorbable sutures followed by a running subcuticular suture. The epidermis was then sealed with Dermabond. The dermatotomy at the venous access site was also closed with a single inverted subdermal suture and the epidermis sealed with Dermabond.  COMPLICATIONS: None.  The patient tolerated the procedure well.  IMPRESSION: Successful placement of a right IJ approach  Power Port with ultrasound and fluoroscopic guidance. The catheter is ready for use.  Signed,  Criselda Peaches, MD   [facility]  [facility]   Electronically Signed   By: Jacqulynn Cadet M.D.   On: 04/27/2013 16:59   Ir US Guide Vasc Access Right  04/27/2013   CLINICAL DATA:  68 year old male with lymphoma in need of durable central venous access for chemotherapy.  EXAM: IR RIGHT FLOURO GUIDE CV LINE; IR ULTRASOUND GUIDANCE VASC ACCESS RIGHT  Date: 04/27/2013  ANESTHESIA/SEDATION: Moderate (conscious) sedation was administered during this procedure. A total of 3 mg Versed and 100 mg Fentanyl were administered intravenously. The patient's vital signs were monitored continuously by radiology nursing throughout the course of the procedure.  Total sedation time: 15 minutes  FLUOROSCOPY TIME:  6 seconds  TECHNIQUE: The right neck and chest was prepped with chlorhexidine, and draped in the usual sterile fashion using maximum barrier technique (cap and mask, sterile gown, sterile gloves, large sterile sheet, hand hygiene and cutaneous antiseptic). Antibiotic prophylaxis was provided with 2g Ancef administered IV one hour prior to skin incision. Local anesthesia was attained by infiltration with 1% lidocaine with epinephrine.  Ultrasound demonstrated patency of the right internal jugular vein, and this was documented with an image. Under real-time ultrasound guidance, this vein was accessed with a 21 gauge micropuncture needle and image documentation was performed. A small dermatotomy was made at the access site with an 11 scalpel. A 0.018" wire was advanced into the SVC and the access needle exchanged for a 55F micropuncture vascular sheath. The 0.018" wire was then removed and a 0.035" wire advanced into the IVC.  An appropriate location for the subcutaneous reservoir was selected below the clavicle and an incision was made through the skin and underlying soft tissues. The subcutaneous tissues were then dissected using a combination of blunt and sharp surgical technique and a pocket was  formed. A single lumen power injectable portacatheter was then tunneled through the subcutaneous tissues from the pocket to the dermatotomy and the port reservoir placed within the subcutaneous pocket.  The venous access site was then serially dilated and a peel away vascular sheath placed over the wire. The wire was removed and the port catheter advanced into position under fluoroscopic guidance. The catheter tip is positioned in the superior cavoatrial junction. This was documented with a spot image. The portacatheter was then tested and found to flush and aspirate well. The port was flushed with saline followed by 100 units/mL heparinized saline.  The pocket was then closed in two layers using first subdermal inverted interrupted absorbable sutures followed by a running subcuticular suture. The epidermis was then sealed with Dermabond. The dermatotomy at the venous access site was also closed with a single inverted subdermal suture and the epidermis sealed with Dermabond.  COMPLICATIONS: None.  The patient tolerated the procedure well.  IMPRESSION: Successful placement of a right IJ approach Power Port with ultrasound and fluoroscopic guidance. The catheter is ready for use.  Signed,  Criselda Peaches, MD  [facility]  [facility]   Electronically Signed   By: Jacqulynn Cadet M.D.   On: 04/27/2013 16:59    ASSESSMENT:  #1. 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 Femoral involvement with disease has been documented with probable sciatic nerve adding to his pain, vastly improved after his first cycle of chemotherapy with decreased  right lower extremity swelling as evidenced by a total 20 pound weight loss and markedly diminished analgesic requirement. #2. Chronic obstructive pulmonary disease. #3. Gastroesophageal reflux disease. #4. Pancytopenia  secondary to chemotherapy.     PLAN:  #1. Continue drinking copious amounts of fluids. #2. Nexium over-the-counter 1 daily. #3. Continue OxyContin 30 mg for 2 more days and then discontinue. Take short acting oxycodone up to every 4 hours to control pain if needed. #4. Followup in 2 weeks to receive cycle #2 of therapy.   All questions were answered. The patient knows to call the clinic with any problems, questions or concerns. We can certainly see the patient much sooner if necessary.   I spent 25 minutes counseling the patient face to face. The total time spent in the appointment was 30 minutes.    Farrel Gobble, MD 05/09/2013 6:31 AM

## 2013-05-08 NOTE — Patient Instructions (Signed)
[facility] Discharge Instructions  RECOMMENDATIONS MADE BY THE CONSULTANT AND ANY TEST RESULTS WILL BE SENT TO YOUR REFERRING PHYSICIAN. We will see you in 2 weeks for your second treatment and a doctor visit. Continue to drink fluids.  Thank you for choosing [facility] to provide your oncology and hematology care.  To afford each patient quality time with our providers, please arrive at least 15 minutes before your scheduled appointment time.  With your help, our goal is to use those 15 minutes to complete the necessary work-up to ensure our physicians have the information they need to help with your evaluation and healthcare recommendations.    Effective January 1st, 2014, we ask that you re-schedule your appointment with our physicians should you arrive 10 or more minutes late for your appointment.  We strive to give you quality time with our providers, and arriving late affects you and other patients whose appointments are after yours.    Again, thank you for choosing [facility].  Our hope is that these requests will decrease the amount of time that you wait before being seen by our physicians.       _____________________________________________________________  Should you have questions after your visit to [facility], please contact our office at [phone] between the hours of 8:30 a.m. and 5:00 p.m.  Voicemails left after 4:30 p.m. will not be returned until the following business day.  For prescription refill requests, have your pharmacy contact our office with your prescription refill request.

## 2013-05-21 ENCOUNTER — Encounter (HOSPITAL_BASED_OUTPATIENT_CLINIC_OR_DEPARTMENT_OTHER): Payer: Medicare Other

## 2013-05-21 ENCOUNTER — Encounter (HOSPITAL_COMMUNITY): Payer: Self-pay

## 2013-05-21 VITALS — BP 108/71 | HR 72 | Temp 98.0°F | Resp 16

## 2013-05-21 DIAGNOSIS — C8589 Other specified types of non-Hodgkin lymphoma, extranodal and solid organ sites: Secondary | ICD-10-CM

## 2013-05-21 DIAGNOSIS — C8580 Other specified types of non-Hodgkin lymphoma, unspecified site: Secondary | ICD-10-CM

## 2013-05-21 DIAGNOSIS — J449 Chronic obstructive pulmonary disease, unspecified: Secondary | ICD-10-CM

## 2013-05-21 DIAGNOSIS — C8330 Diffuse large B-cell lymphoma, unspecified site: Secondary | ICD-10-CM

## 2013-05-21 DIAGNOSIS — Z5112 Encounter for antineoplastic immunotherapy: Secondary | ICD-10-CM

## 2013-05-21 DIAGNOSIS — Z5111 Encounter for antineoplastic chemotherapy: Secondary | ICD-10-CM

## 2013-05-21 DIAGNOSIS — K219 Gastro-esophageal reflux disease without esophagitis: Secondary | ICD-10-CM

## 2013-05-21 LAB — CBC WITH DIFFERENTIAL/PLATELET
Basophils Absolute: 0.1 10*3/uL (ref 0.0–0.1)
Basophils Relative: 1 % (ref 0–1)
Eosinophils Absolute: 0 10*3/uL (ref 0.0–0.7)
Eosinophils Relative: 0 % (ref 0–5)
HCT: 34.3 % — ABNORMAL LOW (ref 39.0–52.0)
HEMOGLOBIN: 10.9 g/dL — AB (ref 13.0–17.0)
LYMPHS ABS: 1.4 10*3/uL (ref 0.7–4.0)
Lymphocytes Relative: 18 % (ref 12–46)
MCH: 27.6 pg (ref 26.0–34.0)
MCHC: 31.8 g/dL (ref 30.0–36.0)
MCV: 86.8 fL (ref 78.0–100.0)
Monocytes Absolute: 0.9 10*3/uL (ref 0.1–1.0)
Monocytes Relative: 12 % (ref 3–12)
NEUTROS ABS: 5.3 10*3/uL (ref 1.7–7.7)
NEUTROS PCT: 69 % (ref 43–77)
PLATELETS: 342 10*3/uL (ref 150–400)
RBC: 3.95 MIL/uL — AB (ref 4.22–5.81)
RDW: 19 % — ABNORMAL HIGH (ref 11.5–15.5)
WBC: 7.6 10*3/uL (ref 4.0–10.5)

## 2013-05-21 LAB — BASIC METABOLIC PANEL
BUN: 10 mg/dL (ref 6–23)
CALCIUM: 8.5 mg/dL (ref 8.4–10.5)
CO2: 29 mEq/L (ref 19–32)
Chloride: 105 mEq/L (ref 96–112)
Creatinine, Ser: 0.64 mg/dL (ref 0.50–1.35)
GFR calc non Af Amer: >90 mL/min (ref >90)
Glucose, Bld: 90 mg/dL (ref 70–99)
POTASSIUM: 3.8 meq/L (ref 3.7–5.3)
SODIUM: 143 meq/L (ref 137–147)

## 2013-05-21 MED ORDER — HEPARIN SOD (PORK) LOCK FLUSH 100 UNIT/ML IV SOLN
500.0000 [IU] | Freq: Once | INTRAVENOUS | Status: AC | PRN
  Administered 2013-05-21: 500 [IU]
  Filled 2013-05-21: qty 5

## 2013-05-21 MED ORDER — SODIUM CHLORIDE 0.9 % IV SOLN
Freq: Once | INTRAVENOUS | Status: AC
  Administered 2013-05-21: 09:00:00 via INTRAVENOUS

## 2013-05-21 MED ORDER — SODIUM CHLORIDE 0.9 % IV SOLN
6.0000 mg | Freq: Once | INTRAVENOUS | Status: AC
  Administered 2013-05-21: 6 mg via INTRAVENOUS
  Filled 2013-05-21: qty 4

## 2013-05-21 MED ORDER — DOXORUBICIN HCL CHEMO IV INJECTION 2 MG/ML
50.0000 mg/m2 | Freq: Once | INTRAVENOUS | Status: AC
  Administered 2013-05-21: 96 mg via INTRAVENOUS
  Filled 2013-05-21: qty 48

## 2013-05-21 MED ORDER — ONDANSETRON HCL 40 MG/20ML IJ SOLN
Freq: Once | INTRAMUSCULAR | Status: AC
  Administered 2013-05-21: 16 mg via INTRAVENOUS
  Filled 2013-05-21: qty 8

## 2013-05-21 MED ORDER — SODIUM CHLORIDE 0.9 % IJ SOLN
10.0000 mL | INTRAMUSCULAR | Status: DC | PRN
  Administered 2013-05-21: 10 mL

## 2013-05-21 MED ORDER — VINCRISTINE SULFATE CHEMO INJECTION 1 MG/ML
1.0000 mg | Freq: Once | INTRAVENOUS | Status: AC
  Administered 2013-05-21: 1 mg via INTRAVENOUS
  Filled 2013-05-21: qty 1

## 2013-05-21 MED ORDER — SODIUM CHLORIDE 0.9 % IV SOLN
375.0000 mg/m2 | Freq: Once | INTRAVENOUS | Status: AC
  Administered 2013-05-21: 700 mg via INTRAVENOUS
  Filled 2013-05-21: qty 70

## 2013-05-21 MED ORDER — SODIUM CHLORIDE 0.9 % IV SOLN
750.0000 mg/m2 | Freq: Once | INTRAVENOUS | Status: AC
  Administered 2013-05-21: 1420 mg via INTRAVENOUS
  Filled 2013-05-21: qty 71

## 2013-05-21 NOTE — Patient Instructions (Signed)
[facility] Discharge Instructions for Patients Receiving Chemotherapy  Today you received the following chemotherapy agents rituxan, adriamycin, vincristine, cytoxan.  Rasburicase today and tomorrow Neulasta 6 mg tomorrow.  To help prevent nausea and vomiting after your treatment, we encourage you to take your nausea medication    If you develop nausea and vomiting that is not controlled by your nausea medication, call the clinic. If it is after clinic hours your family physician or the after hours number for the clinic or go to the Emergency Department.   BELOW ARE SYMPTOMS THAT SHOULD BE REPORTED IMMEDIATELY:  *FEVER GREATER THAN 101.0 F  *CHILLS WITH OR WITHOUT FEVER  NAUSEA AND VOMITING THAT IS NOT CONTROLLED WITH YOUR NAUSEA MEDICATION  *UNUSUAL SHORTNESS OF BREATH  *UNUSUAL BRUISING OR BLEEDING  TENDERNESS IN MOUTH AND THROAT WITH OR WITHOUT PRESENCE OF ULCERS  *URINARY PROBLEMS  *BOWEL PROBLEMS  UNUSUAL RASH Items with * indicate a potential emergency and should be followed up as soon as possible.

## 2013-05-21 NOTE — Progress Notes (Signed)
[facility]  OFFICE PROGRESS NOTE  [name], MD [address] [place] [address]  DIAGNOSIS: Lymphoma malignant, large cell  Chief Complaint  Patient presents with  . Large cell lymphoma    CURRENT THERAPY: R. CHOP with Neulasta support for cycle #2 today  INTERVAL HISTORY: Devin Zuniga 68 y.o. 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 CHOP today with Rasburicase.  Patient has not taken a pain pill for 2 weeks. Appetite is good with normal bowel movements but still with residual right femur pain. Swelling in the right thigh is still slightly present as well. He denies any chest pain, PND, orthopnea, palpitations, fever, night sweats, easy satiety, cough, wheezing, nasal drip, sore throat, earache, headache, or seizures. His hair is falling out.  MEDICAL HISTORY: Past Medical History  Diagnosis Date  . Cancer   . Diffuse large B cell lymphoma 04/24/2013    INTERIM HISTORY: has Piriformis syndrome of right side; Right hip pain; Back pain; Lymphadenopathy, generalized; and Diffuse large B cell lymphoma on his problem list.    ALLERGIES:  has No Known Allergies.  MEDICATIONS: has a current medication list which includes the following prescription(s): allopurinol, capsaicin, hydrocodone-acetaminophen, lidocaine-prilocaine, liniments, metoclopramide, naproxen sodium, ondansetron, oxycodone hcl, oxycodone hcl er, polyethylene glycol, prednisone, prochlorperazine, and sennosides, and the following Facility-Administered Medications: cyclophosphamide (CYTOXAN) 1,420 mg in sodium chloride 0.9 % 250 mL chemo infusion, doxorubicin, heparin lock flush, ondansetron (ZOFRAN) 16 mg, dexamethasone (DECADRON) 20 mg in sodium chloride 0.9 % 50 mL IVPB, riTUXimab (RITUXAN) 700 mg in sodium chloride 0.9 % 250 mL chemo  infusion, sodium chloride, and vinCRIStine (ONCOVIN) 1 mg in sodium chloride 0.9 % 50 mL chemo infusion.  SURGICAL HISTORY:  Past Surgical History  Procedure Laterality Date  . Portacath placement Right 04/27/13    FAMILY HISTORY: family history includes Cancer in his brother.  SOCIAL HISTORY:  reports that he has been smoking Cigarettes.  He has been smoking about 0.25 packs per day. He has never used smokeless tobacco. He reports that he does not drink alcohol or use illicit drugs.  REVIEW OF SYSTEMS:  Other than that discussed above is noncontributory.  PHYSICAL EXAMINATION: ECOG PERFORMANCE STATUS: 1 - Symptomatic but completely ambulatory  There were no vitals taken for this visit.  GENERAL:alert, no distress and comfortable SKIN: skin color, texture, turgor are normal, no rashes or significant lesions EYES: PERLA; Conjunctiva are pink and non-injected, sclera clear SINUSES: No redness or tenderness over maxillary or ethmoid sinuses OROPHARYNX:no exudate, no erythema on lips, buccal mucosa, or tongue. NECK: supple, thyroid normal size, non-tender, without nodularity. No masses CHEST: Increased AP diameter with light port in place. No gynecomastia. LYMPH:  no palpable lymphadenopathy in the cervical, axillary or inguinal LUNGS: clear to auscultation and percussion with normal breathing effort HEART: regular rate & rhythm and no murmurs. ABDOMEN:abdomen soft, non-tender and normal bowel sounds MUSCULOSKELETAL:no cyanosis of digits and no clubbing. Range of motion normal.. Tenderness over the right mid femur with +1 edema. Negative Homans sign.  NEURO: alert & oriented x 3 with fluent speech, no focal motor/sensory deficits   LABORATORY DATA: Infusion on 05/21/2013  Component Date Value Ref Range Status  . Sodium 05/21/2013 143  137 - 147 mEq/L Final  . Potassium 05/21/2013 3.8  3.7 - 5.3 mEq/L Final  . Chloride 05/21/2013 105  96 - 112 mEq/L Final  . CO2 05/21/2013 29  19 -  32 mEq/L Final  . Glucose, Bld 05/21/2013 90  70 - 99 mg/dL Final  . BUN 05/21/2013 10  6 - 23 mg/dL Final  . Creatinine, Ser 05/21/2013 0.64  0.50 - 1.35 mg/dL Final  . Calcium 05/21/2013 8.5  8.4 - 10.5 mg/dL Final  . GFR calc non Af Amer 05/21/2013 >90  >90 mL/min Final  . GFR calc Af Amer 05/21/2013 >90  >90 mL/min Final   Comment: (NOTE)                          The eGFR has been calculated using the CKD EPI equation.                          This calculation has not been validated in all clinical situations.                          eGFR's persistently <90 mL/min signify possible Chronic Kidney                          Disease.  . WBC 05/21/2013 7.6  4.0 - 10.5 K/uL Final  . RBC 05/21/2013 3.95* 4.22 - 5.81 MIL/uL Final  . Hemoglobin 05/21/2013 10.9* 13.0 - 17.0 g/dL Final  . HCT 05/21/2013 34.3* 39.0 - 52.0 % Final  . MCV 05/21/2013 86.8  78.0 - 100.0 fL Final  . MCH 05/21/2013 27.6  26.0 - 34.0 pg Final  . MCHC 05/21/2013 31.8  30.0 - 36.0 g/dL Final  . RDW 05/21/2013 19.0* 11.5 - 15.5 % Final  . Platelets 05/21/2013 342  150 - 400 K/uL Final  . Neutrophils Relative % 05/21/2013 69  43 - 77 % Final  . Neutro Abs 05/21/2013 5.3  1.7 - 7.7 K/uL Final  . Lymphocytes Relative 05/21/2013 18  12 - 46 % Final  . Lymphs Abs 05/21/2013 1.4  0.7 - 4.0 K/uL Final  . Monocytes Relative 05/21/2013 12  3 - 12 % Final  . Monocytes Absolute 05/21/2013 0.9  0.1 - 1.0 K/uL Final  . Eosinophils Relative 05/21/2013 0  0 - 5 % Final  . Eosinophils Absolute 05/21/2013 0.0  0.0 - 0.7 K/uL Final  . Basophils Relative 05/21/2013 1  0 - 1 % Final  . Basophils Absolute 05/21/2013 0.1  0.0 - 0.1 K/uL Final  Appointment on 05/08/2013  Component Date Value Ref Range Status  . WBC 05/08/2013 3.4* 4.0 - 10.5 K/uL Final  . RBC 05/08/2013 3.94* 4.22 - 5.81 MIL/uL Final  . Hemoglobin 05/08/2013 10.5* 13.0 - 17.0 g/dL Final  . HCT 05/08/2013 33.1* 39.0 - 52.0 % Final  . MCV 05/08/2013 84.0  78.0 - 100.0 fL  Final  . MCH 05/08/2013 26.6  26.0 - 34.0 pg Final  . MCHC 05/08/2013 31.7  30.0 - 36.0 g/dL Final  . RDW 05/08/2013 14.9  11.5 - 15.5 % Final  . Platelets 05/08/2013 136* 150 - 400 K/uL Final  . Neutrophils Relative % 05/08/2013 41* 43 - 77 % Final  . Lymphocytes Relative 05/08/2013 41  12 - 46 % Final  . Monocytes Relative 05/08/2013 14* 3 - 12 % Final  . Eosinophils Relative 05/08/2013 2  0 - 5 % Final  . Basophils Relative 05/08/2013 2* 0 - 1 %  Final  . Neutro Abs 05/08/2013 1.3* 1.7 - 7.7 K/uL Final  . Lymphs Abs 05/08/2013 1.4  0.7 - 4.0 K/uL Final  . Monocytes Absolute 05/08/2013 0.5  0.1 - 1.0 K/uL Final  . Eosinophils Absolute 05/08/2013 0.1  0.0 - 0.7 K/uL Final  . Basophils Absolute 05/08/2013 0.1  0.0 - 0.1 K/uL Final  . WBC Morphology 05/08/2013 MILD LEFT SHIFT (1-5% METAS, OCC MYELO, OCC BANDS)   Final   ATYPICAL LYMPHOCYTES  . Sodium 05/08/2013 142  137 - 147 mEq/L Final  . Potassium 05/08/2013 3.9  3.7 - 5.3 mEq/L Final  . Chloride 05/08/2013 102  96 - 112 mEq/L Final  . CO2 05/08/2013 28  19 - 32 mEq/L Final  . Glucose, Bld 05/08/2013 108* 70 - 99 mg/dL Final  . BUN 05/08/2013 12  6 - 23 mg/dL Final  . Creatinine, Ser 05/08/2013 0.61  0.50 - 1.35 mg/dL Final  . Calcium 05/08/2013 9.1  8.4 - 10.5 mg/dL Final  . GFR calc non Af Amer 05/08/2013 >90  >90 mL/min Final  . GFR calc Af Amer 05/08/2013 >90  >90 mL/min Final   Comment: (NOTE)                          The eGFR has been calculated using the CKD EPI equation.                          This calculation has not been validated in all clinical situations.                          eGFR's persistently <90 mL/min signify possible Chronic Kidney                          Disease.  Marland Kitchen Uric Acid, Serum 05/08/2013 3.1* 4.0 - 7.8 mg/dL Final  Infusion on 05/01/2013  Component Date Value Ref Range Status  . Uric Acid, Serum 05/01/2013 0.4* 4.0 - 7.8 mg/dL Final  . Sodium 05/01/2013 138  137 - 147 mEq/L Final  . Potassium  05/01/2013 4.1  3.7 - 5.3 mEq/L Final  . Chloride 05/01/2013 98  96 - 112 mEq/L Final  . CO2 05/01/2013 29  19 - 32 mEq/L Final  . Glucose, Bld 05/01/2013 107* 70 - 99 mg/dL Final  . BUN 05/01/2013 17  6 - 23 mg/dL Final  . Creatinine, Ser 05/01/2013 0.63  0.50 - 1.35 mg/dL Final  . Calcium 05/01/2013 9.0  8.4 - 10.5 mg/dL Final  . GFR calc non Af Amer 05/01/2013 >90  >90 mL/min Final  . GFR calc Af Amer 05/01/2013 >90  >90 mL/min Final   Comment: (NOTE)                          The eGFR has been calculated using the CKD EPI equation.                          This calculation has not been validated in all clinical situations.                          eGFR's persistently <90 mL/min signify possible Chronic Kidney  Disease.  Hospital Outpatient Visit on 04/27/2013  Component Date Value Ref Range Status  . aPTT 04/27/2013 29  24 - 37 seconds Final  . WBC 04/27/2013 11.1* 4.0 - 10.5 K/uL Final  . RBC 04/27/2013 4.49  4.22 - 5.81 MIL/uL Final  . Hemoglobin 04/27/2013 12.1* 13.0 - 17.0 g/dL Final  . HCT 04/27/2013 38.0* 39.0 - 52.0 % Final  . MCV 04/27/2013 84.6  78.0 - 100.0 fL Final  . MCH 04/27/2013 26.9  26.0 - 34.0 pg Final  . MCHC 04/27/2013 31.8  30.0 - 36.0 g/dL Final  . RDW 04/27/2013 14.9  11.5 - 15.5 % Final  . Platelets 04/27/2013 441* 150 - 400 K/uL Final  . Prothrombin Time 04/27/2013 13.6  11.6 - 15.2 seconds Final  . INR 04/27/2013 1.06  0.00 - 1.49 Final  Office Visit on 04/26/2013  Component Date Value Ref Range Status  . Uric Acid, Serum 04/26/2013 4.8  4.0 - 7.8 mg/dL Final  . WBC 04/26/2013 10.1  4.0 - 10.5 K/uL Final  . RBC 04/26/2013 4.43  4.22 - 5.81 MIL/uL Final  . Hemoglobin 04/26/2013 12.2* 13.0 - 17.0 g/dL Final  . HCT 04/26/2013 38.0* 39.0 - 52.0 % Final  . MCV 04/26/2013 85.8  78.0 - 100.0 fL Final  . MCH 04/26/2013 27.5  26.0 - 34.0 pg Final  . MCHC 04/26/2013 32.1  30.0 - 36.0 g/dL Final  . RDW 04/26/2013 14.8  11.5 - 15.5 %  Final  . Platelets 04/26/2013 418* 150 - 400 K/uL Final  . Neutrophils Relative % 04/26/2013 76  43 - 77 % Final  . Neutro Abs 04/26/2013 7.7  1.7 - 7.7 K/uL Final  . Lymphocytes Relative 04/26/2013 18  12 - 46 % Final  . Lymphs Abs 04/26/2013 1.8  0.7 - 4.0 K/uL Final  . Monocytes Relative 04/26/2013 5  3 - 12 % Final  . Monocytes Absolute 04/26/2013 0.5  0.1 - 1.0 K/uL Final  . Eosinophils Relative 04/26/2013 1  0 - 5 % Final  . Eosinophils Absolute 04/26/2013 0.1  0.0 - 0.7 K/uL Final  . Basophils Relative 04/26/2013 0  0 - 1 % Final  . Basophils Absolute 04/26/2013 0.0  0.0 - 0.1 K/uL Final  . Retic Ct Pct 04/26/2013 1.3  0.4 - 3.1 % Final  . RBC. 04/26/2013 4.43  4.22 - 5.81 MIL/uL Final  . Retic Count, Manual 04/26/2013 57.6  19.0 - 186.0 K/uL Final  . Sodium 04/26/2013 142  137 - 147 mEq/L Final  . Potassium 04/26/2013 3.8  3.7 - 5.3 mEq/L Final  . Chloride 04/26/2013 102  96 - 112 mEq/L Final  . CO2 04/26/2013 29  19 - 32 mEq/L Final  . Glucose, Bld 04/26/2013 128* 70 - 99 mg/dL Final  . BUN 04/26/2013 10  6 - 23 mg/dL Final  . Creatinine, Ser 04/26/2013 0.68  0.50 - 1.35 mg/dL Final  . Calcium 04/26/2013 9.3  8.4 - 10.5 mg/dL Final  . Total Protein 04/26/2013 7.1  6.0 - 8.3 g/dL Final  . Albumin 04/26/2013 2.9* 3.5 - 5.2 g/dL Final  . AST 04/26/2013 18  0 - 37 U/L Final  . ALT 04/26/2013 6  0 - 53 U/L Final  . Alkaline Phosphatase 04/26/2013 91  39 - 117 U/L Final  . Total Bilirubin 04/26/2013 0.3  0.3 - 1.2 mg/dL Final  . GFR calc non Af Amer 04/26/2013 >90  >90 mL/min Final  . GFR calc Af Amer 04/26/2013 >90  >  90 mL/min Final   Comment: (NOTE)                          The eGFR has been calculated using the CKD EPI equation.                          This calculation has not been validated in all clinical situations.                          eGFR's persistently <90 mL/min signify possible Chronic Kidney                          Disease.  Infusion on 04/25/2013  Component  Date Value Ref Range Status  . Hepatitis B Surface Ag 04/25/2013 NEGATIVE  NEGATIVE Final   Performed at [facility]  . Hep B C IgM 04/25/2013 NON REACTIVE  NON REACTIVE Final   Comment: (NOTE)                          High levels of Hepatitis B Core IgM antibody are detectable                          during the acute stage of Hepatitis B. This antibody is used                          to differentiate current from past HBV infection.                          Performed at [facility]: No new pathology.  Urinalysis No results found for this basename: colorurine,  appearanceur,  labspec,  phurine,  glucoseu,  hgbur,  bilirubinur,  ketonesur,  proteinur,  urobilinogen,  nitrite,  leukocytesur    RADIOGRAPHIC STUDIES: Nm Cardiac Muga Rest  04/26/2013   CLINICAL DATA:  Malignant large cell lymphoma, baseline prior to cardiotoxic chemotherapy  EXAM: NUCLEAR MEDICINE CARDIAC BLOOD POOL IMAGING (MUGA)  TECHNIQUE: Cardiac multi-gated acquisition was performed at rest following intravenous injection of Tc-64mlabeled red blood cells.  RADIOPHARMACEUTICALS:  25MILLI CURIE ULTRATAG TECHNETIUM TC 18M-LABELED RED BLOOD CELLS IV KIT  COMPARISON:  None  FINDINGS: Calculated left ventricular ejection fraction is 64%, normal.  This was obtained at a cardiac rate of 93 beats per min.  Wall motion analysis of the left ventricle in 3 projections is normal.  IMPRESSION: Normal left ventricular ejection fraction of 64%.  Normal LV wall motion.   Electronically Signed   By: MLavonia DanaM.D.   On: 04/26/2013 15:05   Ir Fluoro Guide Cv Line Right  04/27/2013   CLINICAL DATA:  68year old male with lymphoma in need of durable central venous access for chemotherapy.  EXAM: IR RIGHT FLOURO GUIDE CV LINE; IR ULTRASOUND GUIDANCE VASC ACCESS RIGHT  Date: 04/27/2013  ANESTHESIA/SEDATION: Moderate (conscious) sedation was administered during this procedure. A total of 3 mg Versed and 100 mg  Fentanyl were administered intravenously. The patient's vital signs were monitored continuously by radiology nursing throughout the course of the procedure.  Total sedation time: 15 minutes  FLUOROSCOPY TIME:  6 seconds  TECHNIQUE: The right neck and chest was prepped with chlorhexidine, and draped  in the usual sterile fashion using maximum barrier technique (cap and mask, sterile gown, sterile gloves, large sterile sheet, hand hygiene and cutaneous antiseptic). Antibiotic prophylaxis was provided with 2g Ancef administered IV one hour prior to skin incision. Local anesthesia was attained by infiltration with 1% lidocaine with epinephrine.  Ultrasound demonstrated patency of the right internal jugular vein, and this was documented with an image. Under real-time ultrasound guidance, this vein was accessed with a 21 gauge micropuncture needle and image documentation was performed. A small dermatotomy was made at the access site with an 11 scalpel. A 0.018" wire was advanced into the SVC and the access needle exchanged for a 10F micropuncture vascular sheath. The 0.018" wire was then removed and a 0.035" wire advanced into the IVC.  An appropriate location for the subcutaneous reservoir was selected below the clavicle and an incision was made through the skin and underlying soft tissues. The subcutaneous tissues were then dissected using a combination of blunt and sharp surgical technique and a pocket was formed. A single lumen power injectable portacatheter was then tunneled through the subcutaneous tissues from the pocket to the dermatotomy and the port reservoir placed within the subcutaneous pocket.  The venous access site was then serially dilated and a peel away vascular sheath placed over the wire. The wire was removed and the port catheter advanced into position under fluoroscopic guidance. The catheter tip is positioned in the superior cavoatrial junction. This was documented with a spot image. The  portacatheter was then tested and found to flush and aspirate well. The port was flushed with saline followed by 100 units/mL heparinized saline.  The pocket was then closed in two layers using first subdermal inverted interrupted absorbable sutures followed by a running subcuticular suture. The epidermis was then sealed with Dermabond. The dermatotomy at the venous access site was also closed with a single inverted subdermal suture and the epidermis sealed with Dermabond.  COMPLICATIONS: None.  The patient tolerated the procedure well.  IMPRESSION: Successful placement of a right IJ approach Power Port with ultrasound and fluoroscopic guidance. The catheter is ready for use.  Signed,  Criselda Peaches, MD  [facility]  [facility]   Electronically Signed   By: Jacqulynn Cadet M.D.   On: 04/27/2013 16:59   Ir US Guide Vasc Access Right  04/27/2013   CLINICAL DATA:  68 year old male with lymphoma in need of durable central venous access for chemotherapy.  EXAM: IR RIGHT FLOURO GUIDE CV LINE; IR ULTRASOUND GUIDANCE VASC ACCESS RIGHT  Date: 04/27/2013  ANESTHESIA/SEDATION: Moderate (conscious) sedation was administered during this procedure. A total of 3 mg Versed and 100 mg Fentanyl were administered intravenously. The patient's vital signs were monitored continuously by radiology nursing throughout the course of the procedure.  Total sedation time: 15 minutes  FLUOROSCOPY TIME:  6 seconds  TECHNIQUE: The right neck and chest was prepped with chlorhexidine, and draped in the usual sterile fashion using maximum barrier technique (cap and mask, sterile gown, sterile gloves, large sterile sheet, hand hygiene and cutaneous antiseptic). Antibiotic prophylaxis was provided with 2g Ancef administered IV one hour prior to skin incision. Local anesthesia was attained by infiltration with 1% lidocaine with epinephrine.  Ultrasound demonstrated patency of the right internal  jugular vein, and this was documented with an image. Under real-time ultrasound guidance, this vein was accessed with a 21 gauge micropuncture needle and image documentation was performed. A small dermatotomy was made at the access site  with an 11 scalpel. A 0.018" wire was advanced into the SVC and the access needle exchanged for a 36F micropuncture vascular sheath. The 0.018" wire was then removed and a 0.035" wire advanced into the IVC.  An appropriate location for the subcutaneous reservoir was selected below the clavicle and an incision was made through the skin and underlying soft tissues. The subcutaneous tissues were then dissected using a combination of blunt and sharp surgical technique and a pocket was formed. A single lumen power injectable portacatheter was then tunneled through the subcutaneous tissues from the pocket to the dermatotomy and the port reservoir placed within the subcutaneous pocket.  The venous access site was then serially dilated and a peel away vascular sheath placed over the wire. The wire was removed and the port catheter advanced into position under fluoroscopic guidance. The catheter tip is positioned in the superior cavoatrial junction. This was documented with a spot image. The portacatheter was then tested and found to flush and aspirate well. The port was flushed with saline followed by 100 units/mL heparinized saline.  The pocket was then closed in two layers using first subdermal inverted interrupted absorbable sutures followed by a running subcuticular suture. The epidermis was then sealed with Dermabond. The dermatotomy at the venous access site was also closed with a single inverted subdermal suture and the epidermis sealed with Dermabond.  COMPLICATIONS: None.  The patient tolerated the procedure well.  IMPRESSION: Successful placement of a right IJ approach Power Port with ultrasound and fluoroscopic guidance. The catheter is ready for use.  Signed,  Criselda Peaches,  MD  [facility]  [facility]   Electronically Signed   By: Jacqulynn Cadet M.D.   On: 04/27/2013 16:59    ASSESSMENT:  #1. 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  #2. Chronic obstructive pulmonary disease.  #3. Gastroesophageal reflux disease, controlled. #4. Minimal neutropenia.     PLAN:  #1. R.-CHOP cycle 2 today with Rasburicase followed by Neulasta 6 mg subcutaneous he tomorrow. Patient will take prednisone 100 mg daily for 5 days and he took his initial doses this morning already. #2. Followup in 3 weeks with CBC, basic metabolic profile, and uric acid.   All questions were answered. The patient knows to call the clinic with any problems, questions or concerns. We can certainly see the patient much sooner if necessary.   I spent 25 minutes counseling the patient face to face. The total time spent in the appointment was 30 minutes.    Farrel Gobble, MD 05/21/2013 10:10 AM  DISCLAIMER:  This note was dictated with voice recognition software.  Similar sounding words can inadvertently be transcribed inaccurately and may not be corrected upon review.

## 2013-05-21 NOTE — Progress Notes (Signed)
Tolerated chemo without problems 

## 2013-05-22 ENCOUNTER — Encounter (HOSPITAL_COMMUNITY): Payer: Medicare Other

## 2013-05-22 ENCOUNTER — Encounter (HOSPITAL_BASED_OUTPATIENT_CLINIC_OR_DEPARTMENT_OTHER): Payer: Medicare Other

## 2013-05-22 DIAGNOSIS — C8589 Other specified types of non-Hodgkin lymphoma, extranodal and solid organ sites: Secondary | ICD-10-CM

## 2013-05-22 DIAGNOSIS — C8330 Diffuse large B-cell lymphoma, unspecified site: Secondary | ICD-10-CM

## 2013-05-22 DIAGNOSIS — Z5189 Encounter for other specified aftercare: Secondary | ICD-10-CM

## 2013-05-22 MED ORDER — HEPARIN SOD (PORK) LOCK FLUSH 100 UNIT/ML IV SOLN
INTRAVENOUS | Status: AC
Start: 2013-05-22 — End: 2013-05-22
  Filled 2013-05-22: qty 5

## 2013-05-22 MED ORDER — HEPARIN SOD (PORK) LOCK FLUSH 100 UNIT/ML IV SOLN
500.0000 [IU] | Freq: Once | INTRAVENOUS | Status: AC
  Administered 2013-05-22: 500 [IU] via INTRAVENOUS

## 2013-05-22 MED ORDER — SODIUM CHLORIDE 0.9 % IV SOLN
6.0000 mg | Freq: Once | INTRAVENOUS | Status: AC
  Administered 2013-05-22: 6 mg via INTRAVENOUS
  Filled 2013-05-22: qty 4

## 2013-05-22 MED ORDER — PEGFILGRASTIM INJECTION 6 MG/0.6ML
SUBCUTANEOUS | Status: AC
  Filled 2013-05-22: qty 0.6

## 2013-05-22 MED ORDER — SODIUM CHLORIDE 0.9 % IV SOLN
Freq: Once | INTRAVENOUS | Status: AC
  Administered 2013-05-22: 13:00:00 via INTRAVENOUS

## 2013-05-22 MED ORDER — SODIUM CHLORIDE 0.9 % IJ SOLN
10.0000 mL | Freq: Once | INTRAMUSCULAR | Status: AC
  Administered 2013-05-22: 10 mL via INTRAVENOUS

## 2013-05-22 MED ORDER — PEGFILGRASTIM INJECTION 6 MG/0.6ML
6.0000 mg | Freq: Once | SUBCUTANEOUS | Status: AC
  Administered 2013-05-22: 6 mg via SUBCUTANEOUS

## 2013-05-22 NOTE — Progress Notes (Signed)
See notes on infusion encounter

## 2013-05-22 NOTE — Progress Notes (Signed)
Rubye Beach presents today for injection per MD orders. Neulasta 6mg  administered SQ in right Abdomen. Administration without incident. Tolerated chemo well, slight nausea this am. .

## 2013-05-28 ENCOUNTER — Ambulatory Visit (HOSPITAL_COMMUNITY): Payer: Medicare Other

## 2013-05-28 ENCOUNTER — Encounter (HOSPITAL_COMMUNITY): Payer: Self-pay | Admitting: *Deleted

## 2013-05-28 ENCOUNTER — Inpatient Hospital Stay (HOSPITAL_COMMUNITY): Payer: Medicare Other

## 2013-05-28 DIAGNOSIS — C8330 Diffuse large B-cell lymphoma, unspecified site: Secondary | ICD-10-CM

## 2013-05-28 MED ORDER — FIRST-DUKES MOUTHWASH MT SUSP: OROMUCOSAL | Status: AC

## 2013-05-29 ENCOUNTER — Ambulatory Visit (HOSPITAL_COMMUNITY): Payer: Medicare Other

## 2013-06-11 ENCOUNTER — Encounter (HOSPITAL_COMMUNITY): Payer: Self-pay

## 2013-06-11 ENCOUNTER — Encounter (HOSPITAL_BASED_OUTPATIENT_CLINIC_OR_DEPARTMENT_OTHER): Payer: Medicare Other

## 2013-06-11 ENCOUNTER — Encounter (HOSPITAL_COMMUNITY): Payer: Medicare Other | Attending: Hematology and Oncology

## 2013-06-11 ENCOUNTER — Other Ambulatory Visit (HOSPITAL_COMMUNITY): Payer: Self-pay | Admitting: Hematology and Oncology

## 2013-06-11 VITALS — BP 102/68 | HR 84 | Temp 97.1°F | Resp 16

## 2013-06-11 VITALS — BP 128/78 | HR 84 | Temp 97.3°F | Resp 18 | Wt 154.4 lb

## 2013-06-11 DIAGNOSIS — C8589 Other specified types of non-Hodgkin lymphoma, extranodal and solid organ sites: Secondary | ICD-10-CM | POA: Insufficient documentation

## 2013-06-11 DIAGNOSIS — K219 Gastro-esophageal reflux disease without esophagitis: Secondary | ICD-10-CM

## 2013-06-11 DIAGNOSIS — E876 Hypokalemia: Secondary | ICD-10-CM | POA: Insufficient documentation

## 2013-06-11 DIAGNOSIS — C8330 Diffuse large B-cell lymphoma, unspecified site: Secondary | ICD-10-CM

## 2013-06-11 DIAGNOSIS — Z5112 Encounter for antineoplastic immunotherapy: Secondary | ICD-10-CM

## 2013-06-11 DIAGNOSIS — J449 Chronic obstructive pulmonary disease, unspecified: Secondary | ICD-10-CM

## 2013-06-11 LAB — COMPREHENSIVE METABOLIC PANEL
ALBUMIN: 3 g/dL — AB (ref 3.5–5.2)
ALK PHOS: 92 U/L (ref 39–117)
ALT: 7 U/L (ref 0–53)
AST: 12 U/L (ref 0–37)
BUN: 10 mg/dL (ref 6–23)
CO2: 28 mEq/L (ref 19–32)
Calcium: 8.9 mg/dL (ref 8.4–10.5)
Chloride: 103 mEq/L (ref 96–112)
Creatinine, Ser: 0.79 mg/dL (ref 0.50–1.35)
GFR calc Af Amer: >90 mL/min (ref >90)
GFR calc non Af Amer: >90 mL/min (ref >90)
GLUCOSE: 122 mg/dL — AB (ref 70–99)
Potassium: 3.3 mEq/L — ABNORMAL LOW (ref 3.7–5.3)
SODIUM: 142 meq/L (ref 137–147)
Total Bilirubin: 0.2 mg/dL — ABNORMAL LOW (ref 0.3–1.2)
Total Protein: 5.9 g/dL — ABNORMAL LOW (ref 6.0–8.3)

## 2013-06-11 LAB — CBC WITH DIFFERENTIAL/PLATELET
BASOS PCT: 1 % (ref 0–1)
Basophils Absolute: 0.1 10*3/uL (ref 0.0–0.1)
EOS ABS: 0 10*3/uL (ref 0.0–0.7)
Eosinophils Relative: 0 % (ref 0–5)
HCT: 35.3 % — ABNORMAL LOW (ref 39.0–52.0)
Hemoglobin: 11.3 g/dL — ABNORMAL LOW (ref 13.0–17.0)
LYMPHS ABS: 1.1 10*3/uL (ref 0.7–4.0)
Lymphocytes Relative: 11 % — ABNORMAL LOW (ref 12–46)
MCH: 28.3 pg (ref 26.0–34.0)
MCHC: 32 g/dL (ref 30.0–36.0)
MCV: 88.3 fL (ref 78.0–100.0)
Monocytes Absolute: 1.1 10*3/uL — ABNORMAL HIGH (ref 0.1–1.0)
Monocytes Relative: 11 % (ref 3–12)
Neutro Abs: 7.2 10*3/uL (ref 1.7–7.7)
Neutrophils Relative %: 76 % (ref 43–77)
Platelets: 297 10*3/uL (ref 150–400)
RBC: 4 MIL/uL — ABNORMAL LOW (ref 4.22–5.81)
RDW: 21.7 % — ABNORMAL HIGH (ref 11.5–15.5)
WBC: 9.4 10*3/uL (ref 4.0–10.5)

## 2013-06-11 LAB — MAGNESIUM: Magnesium: 1.7 mg/dL (ref 1.5–2.5)

## 2013-06-11 LAB — URIC ACID: Uric Acid, Serum: 3 mg/dL — ABNORMAL LOW (ref 4.0–7.8)

## 2013-06-11 MED ORDER — SODIUM CHLORIDE 0.9 % IJ SOLN
10.0000 mL | INTRAMUSCULAR | Status: DC | PRN
  Administered 2013-06-11: 10 mL

## 2013-06-11 MED ORDER — SODIUM CHLORIDE 0.9 % IV SOLN
750.0000 mg/m2 | Freq: Once | INTRAVENOUS | Status: AC
  Administered 2013-06-11: 1420 mg via INTRAVENOUS
  Filled 2013-06-11: qty 50

## 2013-06-11 MED ORDER — SODIUM CHLORIDE 0.9 % IV SOLN
Freq: Once | INTRAVENOUS | Status: AC
  Administered 2013-06-11: 10:00:00 via INTRAVENOUS

## 2013-06-11 MED ORDER — SODIUM CHLORIDE 0.9 % IV SOLN
375.0000 mg/m2 | Freq: Once | INTRAVENOUS | Status: AC
  Administered 2013-06-11: 700 mg via INTRAVENOUS
  Filled 2013-06-11: qty 20

## 2013-06-11 MED ORDER — SODIUM CHLORIDE 0.9 % IV SOLN
Freq: Once | INTRAVENOUS | Status: AC
  Administered 2013-06-11: 16 mg via INTRAVENOUS
  Filled 2013-06-11: qty 8

## 2013-06-11 MED ORDER — POTASSIUM CHLORIDE CRYS ER 20 MEQ PO TBCR: 20.0000 meq | EXTENDED_RELEASE_TABLET | Freq: Two times a day (BID) | ORAL | Status: AC

## 2013-06-11 MED ORDER — HEPARIN SOD (PORK) LOCK FLUSH 100 UNIT/ML IV SOLN
500.0000 [IU] | Freq: Once | INTRAVENOUS | Status: AC | PRN
  Administered 2013-06-11: 500 [IU]
  Filled 2013-06-11: qty 5

## 2013-06-11 MED ORDER — VINCRISTINE SULFATE CHEMO INJECTION 1 MG/ML
1.0000 mg | Freq: Once | INTRAVENOUS | Status: AC
  Administered 2013-06-11: 1 mg via INTRAVENOUS
  Filled 2013-06-11: qty 1

## 2013-06-11 MED ORDER — DOXORUBICIN HCL CHEMO IV INJECTION 2 MG/ML
50.0000 mg/m2 | Freq: Once | INTRAVENOUS | Status: AC
  Administered 2013-06-11: 96 mg via INTRAVENOUS
  Filled 2013-06-11: qty 48

## 2013-06-11 NOTE — Progress Notes (Signed)
Tolerated well

## 2013-06-11 NOTE — Progress Notes (Signed)
[place]  OFFICE PROGRESS NOTE  [name], MD [address] [place] [address]  DIAGNOSIS: Diffuse large B cell lymphoma - Plan: CBC with Differential, Comprehensive metabolic panel, Uric acid  Chief Complaint  Patient presents with  . Follow-up    CURRENT THERAPY: R-CHOP therapy 3 weeks, for cycle #3 today  INTERVAL HISTORY: Devin Zuniga 68 y.o. 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 He no longer has an opioid analgesic requirement. He occasionally takes Aleve for right femur discomfort. Appetite has been good with regular bowel movements and no melena, hematochezia, hematuria, or urinary hesitancy. Your still has foul odor but not as intense as 8 weeks ago. Appetite is good with no nausea, vomiting, sore mouth, lower extremity swelling or redness, skin rash, PND, orthopnea, palpitations, headache, or seizures.  MEDICAL HISTORY: Past Medical History  Diagnosis Date  . Cancer   . Diffuse large B cell lymphoma 04/24/2013    INTERIM HISTORY: has Piriformis syndrome of right side; Right hip pain; Back pain; Lymphadenopathy, generalized; and Diffuse large B cell lymphoma on his problem list.    ALLERGIES:  has No Known Allergies.  MEDICATIONS: has a current medication list which includes the following prescription(s): allopurinol, first-dukes mouthwash, esomeprazole, lidocaine-prilocaine, magnesium hydroxide, metoclopramide, naproxen sodium, ondansetron, oxycodone hcl er, prednisone, capsaicin, hydrocodone-acetaminophen, liniments, oxycodone hcl, polyethylene glycol, prochlorperazine, and sennosides.  SURGICAL HISTORY:  Past Surgical History  Procedure Laterality Date  . Portacath placement Right 04/27/13    FAMILY HISTORY: family history  includes Cancer in his brother.  SOCIAL HISTORY:  reports that he has been smoking Cigarettes.  He has been smoking about 0.25 packs per day. He has never used smokeless tobacco. He reports that he does not drink alcohol or use illicit drugs.  REVIEW OF SYSTEMS:  Other than that discussed above is noncontributory.  PHYSICAL EXAMINATION: ECOG PERFORMANCE STATUS: 1 - Symptomatic but completely ambulatory  Blood pressure 128/78, pulse 84, temperature 97.3 F (36.3 C), temperature source Oral, resp. rate 18, weight 154 lb 6.4 oz (70.035 kg).  GENERAL:alert, no distress and comfortable. Thinning of hair but no alopecia. SKIN: skin color, texture, turgor are normal, no rashes or significant lesions EYES: PERLA; Conjunctiva are pink and non-injected, sclera clear SINUSES: No redness or tenderness over maxillary or ethmoid sinuses OROPHARYNX:no exudate, no erythema on lips, buccal mucosa, or tongue. NECK: supple, thyroid normal size, non-tender, without nodularity. No masses CHEST: Increased AP diameter with light port in place. No gynecomastia. LYMPH:  no palpable lymphadenopathy in the cervical, axillary or inguinal LUNGS: clear to auscultation and percussion with normal breathing effort HEART: regular rate & rhythm and no murmurs. No S3 ABDOMEN:abdomen soft, non-tender and normal bowel sounds MUSCULOSKELETAL:no cyanosis of digits and no clubbing. Range of motion normal. Minimal tenderness mid right femur NEURO: alert & oriented x 3 with fluent speech, no focal motor/sensory deficits   LABORATORY DATA: Appointment on 06/11/2013  Component Date Value Ref Range Status  . WBC 06/11/2013 9.4  4.0 - 10.5 K/uL Final  . RBC 06/11/2013 4.00* 4.22 - 5.81 MIL/uL Final  . Hemoglobin 06/11/2013 11.3* 13.0 - 17.0 g/dL Final  . HCT 06/11/2013 35.3* 39.0 - 52.0 % Final  . MCV 06/11/2013 88.3  78.0 - 100.0 fL Final  . MCH 06/11/2013 28.3  26.0 - 34.0 pg Final  . MCHC 06/11/2013 32.0  30.0 - 36.0 g/dL  Final  . RDW 06/11/2013 21.7* 11.5 - 15.5 % Final  . Platelets 06/11/2013 297  150 - 400 K/uL Final  . Neutrophils Relative % 06/11/2013 76  43 - 77 % Final  . Neutro Abs 06/11/2013 7.2  1.7 - 7.7 K/uL Final  . Lymphocytes Relative 06/11/2013 11* 12 - 46 % Final  . Lymphs Abs 06/11/2013 1.1  0.7 - 4.0 K/uL Final  . Monocytes Relative 06/11/2013 11  3 - 12 % Final  . Monocytes Absolute 06/11/2013 1.1* 0.1 - 1.0 K/uL Final  . Eosinophils Relative 06/11/2013 0  0 - 5 % Final  . Eosinophils Absolute 06/11/2013 0.0  0.0 - 0.7 K/uL Final  . Basophils Relative 06/11/2013 1  0 - 1 % Final  . Basophils Absolute 06/11/2013 0.1  0.0 - 0.1 K/uL Final  Infusion on 05/21/2013  Component Date Value Ref Range Status  . Sodium 05/21/2013 143  137 - 147 mEq/L Final  . Potassium 05/21/2013 3.8  3.7 - 5.3 mEq/L Final  . Chloride 05/21/2013 105  96 - 112 mEq/L Final  . CO2 05/21/2013 29  19 - 32 mEq/L Final  . Glucose, Bld 05/21/2013 90  70 - 99 mg/dL Final  . BUN 05/21/2013 10  6 - 23 mg/dL Final  . Creatinine, Ser 05/21/2013 0.64  0.50 - 1.35 mg/dL Final  . Calcium 05/21/2013 8.5  8.4 - 10.5 mg/dL Final  . GFR calc non Af Amer 05/21/2013 >90  >90 mL/min Final  . GFR calc Af Amer 05/21/2013 >90  >90 mL/min Final   Comment: (NOTE)                          The eGFR has been calculated using the CKD EPI equation.                          This calculation has not been validated in all clinical situations.                          eGFR's persistently <90 mL/min signify possible Chronic Kidney                          Disease.  . WBC 05/21/2013 7.6  4.0 - 10.5 K/uL Final  . RBC 05/21/2013 3.95* 4.22 - 5.81 MIL/uL Final  . Hemoglobin 05/21/2013 10.9* 13.0 - 17.0 g/dL Final  . HCT 05/21/2013 34.3* 39.0 - 52.0 % Final  . MCV 05/21/2013 86.8  78.0 - 100.0 fL Final  . MCH 05/21/2013 27.6  26.0 - 34.0 pg Final  . MCHC 05/21/2013 31.8  30.0 - 36.0 g/dL Final  . RDW 05/21/2013 19.0* 11.5 - 15.5 % Final  .  Platelets 05/21/2013 342  150 - 400 K/uL Final  . Neutrophils Relative % 05/21/2013 69  43 - 77 % Final  . Neutro Abs 05/21/2013 5.3  1.7 - 7.7 K/uL Final  . Lymphocytes Relative 05/21/2013 18  12 - 46 % Final  . Lymphs Abs 05/21/2013 1.4  0.7 - 4.0 K/uL Final  . Monocytes Relative 05/21/2013 12  3 - 12 % Final  . Monocytes Absolute 05/21/2013 0.9  0.1 - 1.0 K/uL Final  . Eosinophils Relative 05/21/2013 0  0 - 5 % Final  . Eosinophils  Absolute 05/21/2013 0.0  0.0 - 0.7 K/uL Final  . Basophils Relative 05/21/2013 1  0 - 1 % Final  . Basophils Absolute 05/21/2013 0.1  0.0 - 0.1 K/uL Final    PATHOLOGY: Diffuse large B-cell lymphoma.   Urinalysis No results found for this basename: colorurine, appearanceur, labspec, phurine, glucoseu, hgbur, bilirubinur, ketonesur, proteinur, urobilinogen, nitrite, leukocytesur    RADIOGRAPHIC STUDIES: No results found.  ASSESSMENT:  #1. Stage IV diffuse large B-cell lymphoma with right femur involvement manifesting as generalized lymphadenopathy and right lower extremity swelling with pain, excellent improvement with no analgesic requirement whatsoever but still with residual right femur discomfort without swelling..  #2. Chronic obstructive pulmonary disease.  #3. Gastroesophageal reflux disease, controlled.      PLAN:  #1. R.-CHOP cycle #3 today. #2. Return on 06/12/2013 for Neulasta. #3. No contraindication to going to his daughter's house near the [place]. #4. Return in 3 weeks for cycle #4 after which repeat PET scan will be done.   All questions were answered. The patient knows to call the clinic with any problems, questions or concerns. We can certainly see the patient much sooner if necessary.   I spent 25 minutes counseling the patient face to face. The total time spent in the appointment was 30 minutes.    Farrel Gobble, MD 06/11/2013 9:29 AM  DISCLAIMER:  This note was dictated with voice recognition software.  Similar  sounding words can inadvertently be transcribed inaccurately and may not be corrected upon review.

## 2013-06-11 NOTE — Patient Instructions (Addendum)
[facility] Discharge Instructions  RECOMMENDATIONS MADE BY THE CONSULTANT AND ANY TEST RESULTS WILL BE SENT TO YOUR REFERRING PHYSICIAN.  EXAM FINDINGS BY THE PHYSICIAN TODAY AND SIGNS OR SYMPTOMS TO REPORT TO CLINIC OR PRIMARY PHYSICIAN: Exam and findings as discussed by Dr. Barnet Glasgow.  MEDICATIONS PRESCRIBED:   Potassium 20 mEq (one tablet) twice a day  INSTRUCTIONS/FOLLOW-UP:   Return on 06/12/2013 for Neulasta.   No contraindication to going to your daughter's house near the [place].   Return in 3 weeks for cycle #4, after which repeat PET scan will be done.    Thank you for choosing [facility] to provide your oncology and hematology care.  To afford each patient quality time with our providers, please arrive at least 15 minutes before your scheduled appointment time.  With your help, our goal is to use those 15 minutes to complete the necessary work-up to ensure our physicians have the information they need to help with your evaluation and healthcare recommendations.    Effective January 1st, 2014, we ask that you re-schedule your appointment with our physicians should you arrive 10 or more minutes late for your appointment.  We strive to give you quality time with our providers, and arriving late affects you and other patients whose appointments are after yours.    Again, thank you for choosing [facility].  Our hope is that these requests will decrease the amount of time that you wait before being seen by our physicians.       _____________________________________________________________  Should you have questions after your visit to [facility], please contact our office at [phone] between the hours of 8:30 a.m. and 5:00 p.m.  Voicemails left after 4:30 p.m. will not be returned until the following business day.  For prescription refill requests, have your pharmacy contact our office with your prescription refill  request.

## 2013-06-12 ENCOUNTER — Encounter (HOSPITAL_BASED_OUTPATIENT_CLINIC_OR_DEPARTMENT_OTHER): Payer: Medicare Other

## 2013-06-12 DIAGNOSIS — C8330 Diffuse large B-cell lymphoma, unspecified site: Secondary | ICD-10-CM

## 2013-06-12 DIAGNOSIS — Z5189 Encounter for other specified aftercare: Secondary | ICD-10-CM

## 2013-06-12 DIAGNOSIS — C8589 Other specified types of non-Hodgkin lymphoma, extranodal and solid organ sites: Secondary | ICD-10-CM

## 2013-06-12 MED ORDER — PEGFILGRASTIM INJECTION 6 MG/0.6ML
SUBCUTANEOUS | Status: AC
  Filled 2013-06-12: qty 0.6

## 2013-06-12 MED ORDER — PEGFILGRASTIM INJECTION 6 MG/0.6ML
6.0000 mg | Freq: Once | SUBCUTANEOUS | Status: AC
  Administered 2013-06-12: 6 mg via SUBCUTANEOUS

## 2013-06-12 MED ORDER — PEGFILGRASTIM INJECTION 6 MG/0.6ML: 6.0000 mg | Freq: Once | SUBCUTANEOUS | Status: DC

## 2013-06-12 NOTE — Progress Notes (Signed)
Devin Zuniga's reason for visit today is for an injection and labs as scheduled per MD orders. Rubye Beach also received neulasta 6mg   per MD orders; see [facility] for administration details.  Rubye Beach tolerated all procedures well and without incident; questions were answered and patient was discharged.

## 2013-06-18 ENCOUNTER — Inpatient Hospital Stay (HOSPITAL_COMMUNITY): Payer: Medicare Other

## 2013-06-18 ENCOUNTER — Ambulatory Visit (HOSPITAL_COMMUNITY): Payer: Medicare Other

## 2013-06-19 ENCOUNTER — Ambulatory Visit (HOSPITAL_COMMUNITY): Payer: Medicare Other

## 2013-06-26 ENCOUNTER — Inpatient Hospital Stay (HOSPITAL_COMMUNITY): Payer: Medicare Other

## 2013-06-26 ENCOUNTER — Ambulatory Visit (HOSPITAL_COMMUNITY): Payer: Medicare Other

## 2013-06-27 ENCOUNTER — Ambulatory Visit (HOSPITAL_COMMUNITY): Payer: Medicare Other

## 2013-07-02 ENCOUNTER — Encounter (HOSPITAL_COMMUNITY): Payer: Self-pay

## 2013-07-02 ENCOUNTER — Encounter (HOSPITAL_BASED_OUTPATIENT_CLINIC_OR_DEPARTMENT_OTHER): Payer: Medicare Other

## 2013-07-02 ENCOUNTER — Encounter (HOSPITAL_COMMUNITY): Payer: Medicare Other | Attending: Hematology and Oncology

## 2013-07-02 VITALS — BP 91/53 | HR 81 | Temp 98.0°F | Resp 18 | Wt 153.2 lb

## 2013-07-02 DIAGNOSIS — C8330 Diffuse large B-cell lymphoma, unspecified site: Secondary | ICD-10-CM

## 2013-07-02 DIAGNOSIS — C8589 Other specified types of non-Hodgkin lymphoma, extranodal and solid organ sites: Secondary | ICD-10-CM | POA: Insufficient documentation

## 2013-07-02 DIAGNOSIS — Z5112 Encounter for antineoplastic immunotherapy: Secondary | ICD-10-CM

## 2013-07-02 DIAGNOSIS — J309 Allergic rhinitis, unspecified: Secondary | ICD-10-CM

## 2013-07-02 DIAGNOSIS — J449 Chronic obstructive pulmonary disease, unspecified: Secondary | ICD-10-CM

## 2013-07-02 DIAGNOSIS — Z5111 Encounter for antineoplastic chemotherapy: Secondary | ICD-10-CM

## 2013-07-02 LAB — COMPREHENSIVE METABOLIC PANEL
ALT: 6 U/L (ref 0–53)
AST: 14 U/L (ref 0–37)
Albumin: 3.2 g/dL — ABNORMAL LOW (ref 3.5–5.2)
Alkaline Phosphatase: 99 U/L (ref 39–117)
BUN: 14 mg/dL (ref 6–23)
CALCIUM: 9.1 mg/dL (ref 8.4–10.5)
CHLORIDE: 102 meq/L (ref 96–112)
CO2: 25 mEq/L (ref 19–32)
Creatinine, Ser: 0.73 mg/dL (ref 0.50–1.35)
GFR calc Af Amer: >90 mL/min (ref >90)
GFR calc non Af Amer: >90 mL/min (ref >90)
Glucose, Bld: 137 mg/dL — ABNORMAL HIGH (ref 70–99)
Potassium: 4.2 mEq/L (ref 3.7–5.3)
SODIUM: 139 meq/L (ref 137–147)
Total Bilirubin: 0.2 mg/dL — ABNORMAL LOW (ref 0.3–1.2)
Total Protein: 6.2 g/dL (ref 6.0–8.3)

## 2013-07-02 LAB — CBC WITH DIFFERENTIAL/PLATELET
BASOS ABS: 0.1 10*3/uL (ref 0.0–0.1)
Basophils Relative: 1 % (ref 0–1)
EOS PCT: 0 % (ref 0–5)
Eosinophils Absolute: 0 10*3/uL (ref 0.0–0.7)
HCT: 35.4 % — ABNORMAL LOW (ref 39.0–52.0)
Hemoglobin: 11.5 g/dL — ABNORMAL LOW (ref 13.0–17.0)
LYMPHS PCT: 9 % — AB (ref 12–46)
Lymphs Abs: 1 10*3/uL (ref 0.7–4.0)
MCH: 28.8 pg (ref 26.0–34.0)
MCHC: 32.5 g/dL (ref 30.0–36.0)
MCV: 88.7 fL (ref 78.0–100.0)
Monocytes Absolute: 1.1 10*3/uL — ABNORMAL HIGH (ref 0.1–1.0)
Monocytes Relative: 10 % (ref 3–12)
NEUTROS ABS: 8.4 10*3/uL — AB (ref 1.7–7.7)
Neutrophils Relative %: 80 % — ABNORMAL HIGH (ref 43–77)
PLATELETS: 291 10*3/uL (ref 150–400)
RBC: 3.99 MIL/uL — ABNORMAL LOW (ref 4.22–5.81)
RDW: 22.5 % — AB (ref 11.5–15.5)
WBC: 10.5 10*3/uL (ref 4.0–10.5)

## 2013-07-02 LAB — URIC ACID: Uric Acid, Serum: 2.8 mg/dL — ABNORMAL LOW (ref 4.0–7.8)

## 2013-07-02 MED ORDER — SODIUM CHLORIDE 0.9 % IV SOLN
750.0000 mg/m2 | Freq: Once | INTRAVENOUS | Status: AC
  Administered 2013-07-02: 1420 mg via INTRAVENOUS
  Filled 2013-07-02: qty 25

## 2013-07-02 MED ORDER — HEPARIN SOD (PORK) LOCK FLUSH 100 UNIT/ML IV SOLN
500.0000 [IU] | Freq: Once | INTRAVENOUS | Status: AC | PRN
  Administered 2013-07-02: 500 [IU]
  Filled 2013-07-02: qty 5

## 2013-07-02 MED ORDER — SODIUM CHLORIDE 0.9 % IV SOLN
Freq: Once | INTRAVENOUS | Status: AC
  Administered 2013-07-02: 10:00:00 via INTRAVENOUS

## 2013-07-02 MED ORDER — DOXORUBICIN HCL CHEMO IV INJECTION 2 MG/ML
50.0000 mg/m2 | Freq: Once | INTRAVENOUS | Status: AC
  Administered 2013-07-02: 96 mg via INTRAVENOUS
  Filled 2013-07-02: qty 48

## 2013-07-02 MED ORDER — SODIUM CHLORIDE 0.9 % IV SOLN
Freq: Once | INTRAVENOUS | Status: AC
  Administered 2013-07-02: 16 mg via INTRAVENOUS
  Filled 2013-07-02: qty 8

## 2013-07-02 MED ORDER — SODIUM CHLORIDE 0.9 % IV SOLN
375.0000 mg/m2 | Freq: Once | INTRAVENOUS | Status: AC
  Administered 2013-07-02: 700 mg via INTRAVENOUS
  Filled 2013-07-02: qty 50

## 2013-07-02 MED ORDER — VINCRISTINE SULFATE CHEMO INJECTION 1 MG/ML
1.0000 mg | Freq: Once | INTRAVENOUS | Status: AC
  Administered 2013-07-02: 1 mg via INTRAVENOUS
  Filled 2013-07-02: qty 1

## 2013-07-02 MED ORDER — SODIUM CHLORIDE 0.9 % IJ SOLN: 10.0000 mL | INTRAMUSCULAR | Status: DC | PRN

## 2013-07-02 NOTE — Progress Notes (Signed)
Tolerated well

## 2013-07-02 NOTE — Patient Instructions (Signed)
[facility] Discharge Instructions for Patients Receiving Chemotherapy  Today you received cycle 4 chemotherapy, neulasta tomorrow. We will schedule Pet scan in 2 weeks  To help prevent nausea and vomiting after your treatment, we encourage you to take your nausea medication as ordered. If you develop nausea and vomiting that is not controlled by your nausea medication, call the clinic. If it is after clinic hours your family physician or the after hours number for the clinic or go to the Emergency Department.   BELOW ARE SYMPTOMS THAT SHOULD BE REPORTED IMMEDIATELY:  *FEVER GREATER THAN 101.0 F  *CHILLS WITH OR WITHOUT FEVER  NAUSEA AND VOMITING THAT IS NOT CONTROLLED WITH YOUR NAUSEA MEDICATION  *UNUSUAL SHORTNESS OF BREATH  *UNUSUAL BRUISING OR BLEEDING  TENDERNESS IN MOUTH AND THROAT WITH OR WITHOUT PRESENCE OF ULCERS  *URINARY PROBLEMS  *BOWEL PROBLEMS  UNUSUAL RASH Items with * indicate a potential emergency and should be followed up as soon as possible.

## 2013-07-02 NOTE — Progress Notes (Signed)
Devin Zuniga  OFFICE PROGRESS NOTE  [name], MD [address] [place] [address]  DIAGNOSIS: Diffuse large B cell lymphoma - Plan: NM PET Image Restag (PS) Skull Base To Thigh  Chief Complaint  Patient presents with  . Diffuse large B-cell lymphoma, stage IV    CURRENT THERAPY: R.-CHOP therapy every 3 weeks for cycle #4 today.  INTERVAL HISTORY: Devin Zuniga 68 y.o. 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  He has developed congestion in both sinuses anteriorly with slight earache on the right. He denies any sore throat or fever. He also denies any worsening cough, expectoration, abdominal pain, lower extremity swelling or redness, PND, orthopnea, palpitations, headache, or seizures. Appetite has been good with no diarrhea, constipation, melena, hematochezia, or severe right femur pain.  MEDICAL HISTORY: Past Medical History  Diagnosis Date  . Cancer   . Diffuse large B cell lymphoma 04/24/2013    INTERIM HISTORY: has Piriformis syndrome of right side; Right hip pain; Back pain; Lymphadenopathy, generalized; Diffuse large B cell lymphoma; and Hypokalemia on his problem list.    ALLERGIES:  has No Known Allergies.  MEDICATIONS: has a current medication list which includes the following prescription(s): allopurinol, capsaicin, first-dukes mouthwash, esomeprazole, hydrocodone-acetaminophen, lidocaine-prilocaine, liniments, magnesium hydroxide, metoclopramide, naproxen sodium, ondansetron, oxycodone hcl, oxycodone hcl er, polyethylene glycol, potassium chloride sa, prednisone, prochlorperazine, pseudoephedrine, and sennosides.  SURGICAL HISTORY:  Past Surgical History  Procedure Laterality Date  . Portacath placement Right 04/27/13    FAMILY  HISTORY: family history includes Cancer in his brother.  SOCIAL HISTORY:  reports that he has been smoking Cigarettes.  He has been smoking about 0.25 packs per day. He has never used smokeless tobacco. He reports that he does not drink alcohol or use illicit drugs.  REVIEW OF SYSTEMS:  Other than that discussed above is noncontributory.  PHYSICAL EXAMINATION: ECOG PERFORMANCE STATUS: 1 - Symptomatic but completely ambulatory  There were no vitals taken for this visit.  GENERAL:alert, no distress and comfortable SKIN: skin color, texture, turgor are normal, no rashes or significant lesions EYES: PERLA; Conjunctiva are pink and non-injected, sclera clear SINUSES: No redness or tenderness over maxillary or ethmoid sinuses OROPHARYNX:no exudate, no erythema on lips, buccal mucosa, or tongue. No pharyngeal injection. NECK: supple, thyroid normal size, non-tender, without nodularity. No masses CHEST: Increased AP diameter with light port in place. LYMPH:  no palpable lymphadenopathy right submandibular lymph node measuring 1.5 cm, non-fixed, nontender. LUNGS: clear to auscultation and percussion with normal breathing effort HEART: regular rate & rhythm and no murmurs. ABDOMEN:abdomen soft, non-tender and normal bowel sounds MUSCULOSKELETAL:no cyanosis of digits and no clubbing. Range of motion normal. Minimal tenderness right mid femur. NEURO: alert & oriented x 3 with fluent speech, no focal motor/sensory deficits   LABORATORY DATA: Infusion on 07/02/2013  Component Date Value Ref Range Status  . WBC 07/02/2013 10.5  4.0 - 10.5 K/uL Final  . RBC 07/02/2013 3.99* 4.22 - 5.81 MIL/uL Final  . Hemoglobin 07/02/2013 11.5* 13.0 - 17.0 g/dL Final  . HCT 07/02/2013 35.4* 39.0 - 52.0 % Final  . MCV 07/02/2013 88.7  78.0 - 100.0 fL Final  . MCH 07/02/2013 28.8  26.0 - 34.0 pg Final  . MCHC 07/02/2013 32.5  30.0 - 36.0 g/dL Final  .  RDW 07/02/2013 22.5* 11.5 - 15.5 % Final  . Platelets  07/02/2013 291  150 - 400 K/uL Final  . Neutrophils Relative % 07/02/2013 80* 43 - 77 % Final  . Neutro Abs 07/02/2013 8.4* 1.7 - 7.7 K/uL Final  . Lymphocytes Relative 07/02/2013 9* 12 - 46 % Final  . Lymphs Abs 07/02/2013 1.0  0.7 - 4.0 K/uL Final  . Monocytes Relative 07/02/2013 10  3 - 12 % Final  . Monocytes Absolute 07/02/2013 1.1* 0.1 - 1.0 K/uL Final  . Eosinophils Relative 07/02/2013 0  0 - 5 % Final  . Eosinophils Absolute 07/02/2013 0.0  0.0 - 0.7 K/uL Final  . Basophils Relative 07/02/2013 1  0 - 1 % Final  . Basophils Absolute 07/02/2013 0.1  0.0 - 0.1 K/uL Final  Infusion on 06/11/2013  Component Date Value Ref Range Status  . WBC 06/11/2013 9.4  4.0 - 10.5 K/uL Final  . RBC 06/11/2013 4.00* 4.22 - 5.81 MIL/uL Final  . Hemoglobin 06/11/2013 11.3* 13.0 - 17.0 g/dL Final  . HCT 06/11/2013 35.3* 39.0 - 52.0 % Final  . MCV 06/11/2013 88.3  78.0 - 100.0 fL Final  . MCH 06/11/2013 28.3  26.0 - 34.0 pg Final  . MCHC 06/11/2013 32.0  30.0 - 36.0 g/dL Final  . RDW 06/11/2013 21.7* 11.5 - 15.5 % Final  . Platelets 06/11/2013 297  150 - 400 K/uL Final  . Neutrophils Relative % 06/11/2013 76  43 - 77 % Final  . Neutro Abs 06/11/2013 7.2  1.7 - 7.7 K/uL Final  . Lymphocytes Relative 06/11/2013 11* 12 - 46 % Final  . Lymphs Abs 06/11/2013 1.1  0.7 - 4.0 K/uL Final  . Monocytes Relative 06/11/2013 11  3 - 12 % Final  . Monocytes Absolute 06/11/2013 1.1* 0.1 - 1.0 K/uL Final  . Eosinophils Relative 06/11/2013 0  0 - 5 % Final  . Eosinophils Absolute 06/11/2013 0.0  0.0 - 0.7 K/uL Final  . Basophils Relative 06/11/2013 1  0 - 1 % Final  . Basophils Absolute 06/11/2013 0.1  0.0 - 0.1 K/uL Final  . Sodium 06/11/2013 142  137 - 147 mEq/L Final  . Potassium 06/11/2013 3.3* 3.7 - 5.3 mEq/L Final  . Chloride 06/11/2013 103  96 - 112 mEq/L Final  . CO2 06/11/2013 28  19 - 32 mEq/L Final  . Glucose, Bld 06/11/2013 122* 70 - 99 mg/dL Final  . BUN 06/11/2013 10  6 - 23 mg/dL Final  .  Creatinine, Ser 06/11/2013 0.79  0.50 - 1.35 mg/dL Final  . Calcium 06/11/2013 8.9  8.4 - 10.5 mg/dL Final  . Total Protein 06/11/2013 5.9* 6.0 - 8.3 g/dL Final  . Albumin 06/11/2013 3.0* 3.5 - 5.2 g/dL Final  . AST 06/11/2013 12  0 - 37 U/L Final  . ALT 06/11/2013 7  0 - 53 U/L Final  . Alkaline Phosphatase 06/11/2013 92  39 - 117 U/L Final  . Total Bilirubin 06/11/2013 0.2* 0.3 - 1.2 mg/dL Final  . GFR calc non Af Amer 06/11/2013 >90  >90 mL/min Final  . GFR calc Af Amer 06/11/2013 >90  >90 mL/min Final   Comment: (NOTE)                          The eGFR has been calculated using the CKD EPI equation.  This calculation has not been validated in all clinical situations.                          eGFR's persistently <90 mL/min signify possible Chronic Kidney                          Disease.  Marland Kitchen Uric Acid, Serum 06/11/2013 3.0* 4.0 - 7.8 mg/dL Final  . Magnesium 06/11/2013 1.7  1.5 - 2.5 mg/dL Final    PATHOLOGY: No new pathology.  Urinalysis No results found for this basename: colorurine,  appearanceur,  labspec,  phurine,  glucoseu,  hgbur,  bilirubinur,  ketonesur,  proteinur,  urobilinogen,  nitrite,  leukocytesur    RADIOGRAPHIC STUDIES: No results found.  ASSESSMENT:  #1. Stage IV diffuse large B-cell lymphoma with right femur involvement manifesting as generalized lymphadenopathy and right lower extremity swelling with pain, excellent improvement with no analgesic requirement whatsoever but still with residual right femur discomfort without swelling..  #2. Chronic obstructive pulmonary disease.  #3. Gastroesophageal reflux disease, controlled.  #4. Symptomatic allergic rhinitis.    PLAN:  #1. Claritin-D 24 one daily. Call if develops fever. #2. Our-CHOP cycle #4 today. #3. PET CT scan in 2 weeks. If negative, to more cycles of treatment will be given. #4. Followup in 3 weeks with CBC, chem profile, uric acid, to receive cycle #5 of  treatment.   All questions were answered. The patient knows to call the clinic with any problems, questions or concerns. We can certainly see the patient much sooner if necessary.   I spent 25 minutes counseling the patient face to face. The total time spent in the appointment was 30 minutes.    Farrel Gobble, MD 07/02/2013 10:02 AM  DISCLAIMER:  This note was dictated with voice recognition software.  Similar sounding words can inadvertently be transcribed inaccurately and may not be corrected upon review.

## 2013-07-03 ENCOUNTER — Encounter (HOSPITAL_BASED_OUTPATIENT_CLINIC_OR_DEPARTMENT_OTHER): Payer: Medicare Other

## 2013-07-03 VITALS — BP 109/70 | HR 90 | Temp 98.5°F | Resp 18

## 2013-07-03 DIAGNOSIS — Z5189 Encounter for other specified aftercare: Secondary | ICD-10-CM

## 2013-07-03 DIAGNOSIS — C8589 Other specified types of non-Hodgkin lymphoma, extranodal and solid organ sites: Secondary | ICD-10-CM

## 2013-07-03 DIAGNOSIS — C8330 Diffuse large B-cell lymphoma, unspecified site: Secondary | ICD-10-CM

## 2013-07-03 MED ORDER — PEGFILGRASTIM INJECTION 6 MG/0.6ML
6.0000 mg | Freq: Once | SUBCUTANEOUS | Status: AC
  Administered 2013-07-03: 6 mg via SUBCUTANEOUS

## 2013-07-03 MED ORDER — PEGFILGRASTIM INJECTION 6 MG/0.6ML
SUBCUTANEOUS | Status: AC
  Filled 2013-07-03: qty 0.6

## 2013-07-03 NOTE — Progress Notes (Signed)
Devin Zuniga presents today for injection per MD orders. Neulasta 6mg administered SQ in right Abdomen. Administration without incident. Patient tolerated well.  

## 2013-07-05 ENCOUNTER — Telehealth (HOSPITAL_COMMUNITY): Payer: Self-pay

## 2013-07-16 ENCOUNTER — Ambulatory Visit (HOSPITAL_COMMUNITY)
Admission: RE | Admit: 2013-07-16 | Discharge: 2013-07-16 | Disposition: A | Discharge status: Home / Self Care | Payer: Medicare Other | Source: Ambulatory Visit | Attending: Hematology and Oncology | Admitting: Hematology and Oncology

## 2013-07-16 ENCOUNTER — Other Ambulatory Visit (HOSPITAL_COMMUNITY): Payer: Self-pay | Admitting: Hematology and Oncology

## 2013-07-16 DIAGNOSIS — C8588 Other specified types of non-Hodgkin lymphoma, lymph nodes of multiple sites: Secondary | ICD-10-CM | POA: Insufficient documentation

## 2013-07-16 DIAGNOSIS — C8330 Diffuse large B-cell lymphoma, unspecified site: Secondary | ICD-10-CM

## 2013-07-16 LAB — GLUCOSE, CAPILLARY: Glucose-Capillary: 100 mg/dL — ABNORMAL HIGH (ref 70–99)

## 2013-07-16 MED ORDER — FLUDEOXYGLUCOSE F - 18 (FDG) INJECTION
8.1000 | Freq: Once | INTRAVENOUS | Status: AC | PRN
  Administered 2013-07-16: 8.1 via INTRAVENOUS

## 2013-07-23 ENCOUNTER — Encounter (HOSPITAL_BASED_OUTPATIENT_CLINIC_OR_DEPARTMENT_OTHER): Payer: Medicare Other

## 2013-07-23 ENCOUNTER — Encounter (HOSPITAL_COMMUNITY): Payer: Self-pay

## 2013-07-23 VITALS — BP 92/52 | HR 77 | Temp 98.4°F | Resp 18 | Wt 153.2 lb

## 2013-07-23 DIAGNOSIS — Z5112 Encounter for antineoplastic immunotherapy: Secondary | ICD-10-CM

## 2013-07-23 DIAGNOSIS — Z5111 Encounter for antineoplastic chemotherapy: Secondary | ICD-10-CM

## 2013-07-23 DIAGNOSIS — C8330 Diffuse large B-cell lymphoma, unspecified site: Secondary | ICD-10-CM

## 2013-07-23 DIAGNOSIS — J449 Chronic obstructive pulmonary disease, unspecified: Secondary | ICD-10-CM

## 2013-07-23 DIAGNOSIS — C8589 Other specified types of non-Hodgkin lymphoma, extranodal and solid organ sites: Secondary | ICD-10-CM

## 2013-07-23 LAB — CBC WITH DIFFERENTIAL/PLATELET
BASOS ABS: 0.1 10*3/uL (ref 0.0–0.1)
BASOS PCT: 1 % (ref 0–1)
Eosinophils Absolute: 0 10*3/uL (ref 0.0–0.7)
Eosinophils Relative: 0 % (ref 0–5)
HCT: 36.2 % — ABNORMAL LOW (ref 39.0–52.0)
Hemoglobin: 11.9 g/dL — ABNORMAL LOW (ref 13.0–17.0)
Lymphocytes Relative: 10 % — ABNORMAL LOW (ref 12–46)
Lymphs Abs: 0.9 10*3/uL (ref 0.7–4.0)
MCH: 29.8 pg (ref 26.0–34.0)
MCHC: 32.9 g/dL (ref 30.0–36.0)
MCV: 90.7 fL (ref 78.0–100.0)
Monocytes Absolute: 1 10*3/uL (ref 0.1–1.0)
Monocytes Relative: 11 % (ref 3–12)
NEUTROS PCT: 78 % — AB (ref 43–77)
Neutro Abs: 7 10*3/uL (ref 1.7–7.7)
PLATELETS: 328 10*3/uL (ref 150–400)
RBC: 3.99 MIL/uL — ABNORMAL LOW (ref 4.22–5.81)
RDW: 22.2 % — ABNORMAL HIGH (ref 11.5–15.5)
WBC: 8.9 10*3/uL (ref 4.0–10.5)

## 2013-07-23 LAB — COMPREHENSIVE METABOLIC PANEL
ALBUMIN: 3.4 g/dL — AB (ref 3.5–5.2)
ALT: 8 U/L (ref 0–53)
AST: 14 U/L (ref 0–37)
Alkaline Phosphatase: 109 U/L (ref 39–117)
BUN: 11 mg/dL (ref 6–23)
CO2: 28 mEq/L (ref 19–32)
CREATININE: 0.75 mg/dL (ref 0.50–1.35)
Calcium: 9.2 mg/dL (ref 8.4–10.5)
Chloride: 103 mEq/L (ref 96–112)
GFR calc Af Amer: >90 mL/min (ref >90)
GFR calc non Af Amer: >90 mL/min (ref >90)
Glucose, Bld: 105 mg/dL — ABNORMAL HIGH (ref 70–99)
POTASSIUM: 4 meq/L (ref 3.7–5.3)
Sodium: 142 mEq/L (ref 137–147)
Total Bilirubin: <0.2 mg/dL — ABNORMAL LOW (ref 0.3–1.2)
Total Protein: 6.6 g/dL (ref 6.0–8.3)

## 2013-07-23 LAB — URIC ACID: Uric Acid, Serum: 3 mg/dL — ABNORMAL LOW (ref 4.0–7.8)

## 2013-07-23 MED ORDER — SODIUM CHLORIDE 0.9 % IV SOLN
Freq: Once | INTRAVENOUS | Status: AC
  Administered 2013-07-23: 16 mg via INTRAVENOUS
  Filled 2013-07-23: qty 8

## 2013-07-23 MED ORDER — HEPARIN SOD (PORK) LOCK FLUSH 100 UNIT/ML IV SOLN
500.0000 [IU] | Freq: Once | INTRAVENOUS | Status: AC | PRN
  Administered 2013-07-23: 500 [IU]
  Filled 2013-07-23: qty 5

## 2013-07-23 MED ORDER — VINCRISTINE SULFATE CHEMO INJECTION 1 MG/ML
1.0000 mg | Freq: Once | INTRAVENOUS | Status: AC
  Administered 2013-07-23: 1 mg via INTRAVENOUS
  Filled 2013-07-23: qty 1

## 2013-07-23 MED ORDER — DOXORUBICIN HCL CHEMO IV INJECTION 2 MG/ML
50.0000 mg/m2 | Freq: Once | INTRAVENOUS | Status: AC
  Administered 2013-07-23: 96 mg via INTRAVENOUS
  Filled 2013-07-23: qty 48

## 2013-07-23 MED ORDER — SODIUM CHLORIDE 0.9 % IV SOLN
Freq: Once | INTRAVENOUS | Status: AC
  Administered 2013-07-23: 10:00:00 via INTRAVENOUS

## 2013-07-23 MED ORDER — RITUXIMAB CHEMO INJECTION 10 MG/ML
375.0000 mg/m2 | Freq: Once | INTRAVENOUS | Status: AC
  Administered 2013-07-23: 700 mg via INTRAVENOUS
  Filled 2013-07-23: qty 50

## 2013-07-23 MED ORDER — SODIUM CHLORIDE 0.9 % IV SOLN
750.0000 mg/m2 | Freq: Once | INTRAVENOUS | Status: AC
  Administered 2013-07-23: 1420 mg via INTRAVENOUS
  Filled 2013-07-23: qty 25

## 2013-07-23 MED ORDER — SODIUM CHLORIDE 0.9 % IJ SOLN
10.0000 mL | INTRAMUSCULAR | Status: DC | PRN
  Administered 2013-07-23: 10 mL

## 2013-07-23 NOTE — Patient Instructions (Addendum)
[facility] Discharge Instructions for Patients Receiving Chemotherapy  Today you received the following chemotherapy agents rituxan, adriamycin, cytoxan, and vincristine.  treatment will be extended out to 8 cycles as a result of the incomplete PET scan findings.  Followup in 3 weeks with cycle #6 of treatment. After 8 cycles of treatment, repeat PET scan will be performed to assess completeness of response.   To help prevent nausea and vomiting after your treatment, we encourage you to take your nausea medication  If you develop nausea and vomiting that is not controlled by your nausea medication, call the clinic. If it is after clinic hours your family physician or the after hours number for the clinic or go to the Emergency Department.   BELOW ARE SYMPTOMS THAT SHOULD BE REPORTED IMMEDIATELY:  *FEVER GREATER THAN 101.0 F  *CHILLS WITH OR WITHOUT FEVER  NAUSEA AND VOMITING THAT IS NOT CONTROLLED WITH YOUR NAUSEA MEDICATION  *UNUSUAL SHORTNESS OF BREATH  *UNUSUAL BRUISING OR BLEEDING  TENDERNESS IN MOUTH AND THROAT WITH OR WITHOUT PRESENCE OF ULCERS  *URINARY PROBLEMS  *BOWEL PROBLEMS  UNUSUAL RASH Items with * indicate a potential emergency and should be followed up as soon as possible.  One of the nurses will contact you 24 hours after your treatment. Please let the nurse know about any problems that you may have experienced. Feel free to call the clinic you have any questions or concerns. The clinic phone number is [phone].   I have been informed and understand all the instructions given to me. I know to contact the clinic, my physician, or go to the Emergency Department if any problems should occur. I do not have any questions at this time, but understand that I may call the clinic during office hours or the Patient Navigator at [phone] should I have any questions or need assistance in obtaining follow up  care.    __________________________________________  _____________  __________ Signature of Patient or Authorized Representative            Date                   Time    __________________________________________ Nurse's Signature

## 2013-07-23 NOTE — Progress Notes (Signed)
Tolerated infusion well. 

## 2013-07-23 NOTE — Progress Notes (Signed)
[place]  OFFICE PROGRESS NOTE  [name], MD [address] [place] [address]  DIAGNOSIS: Diffuse large B cell lymphoma  Chief Complaint  Patient presents with  . Diffuse large B-cell lymphoma    CURRENT THERAPY: R.-CHOP every 3 weeks for cycle #5 today  INTERVAL HISTORY: Devin Zuniga 68 y.o. 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  Sinus congestion has improved with Claritin-D 24 but the patient stopped the medication after 10 doses. He still has watery nasal clear discharge with fullness in the maxillary and frontal sinuses. He denies any earache. He denies any fever, night sweats, abdominal pain, and has had marked reduction in right mid femur pain. He denies any lower extremity swelling or redness, cough, expectoration, diarrhea, constipation, melena, hematochezia, hematuria, incontinence, skin rash, headache, or seizures.  MEDICAL HISTORY: Past Medical History  Diagnosis Date  . Cancer   . Diffuse large B cell lymphoma 04/24/2013    INTERIM HISTORY: has Piriformis syndrome of right side; Right hip pain; Back pain; Lymphadenopathy, generalized; Diffuse large B cell lymphoma; and Hypokalemia on his problem list.    ALLERGIES:  has No Known Allergies.  MEDICATIONS: has a current medication list which includes the following prescription(s): allopurinol, capsaicin, first-dukes mouthwash, esomeprazole, hydrocodone-acetaminophen, lidocaine-prilocaine, liniments, loratadine-pseudoephedrine, magnesium hydroxide, metoclopramide, ondansetron, oxycodone hcl, potassium chloride sa, prednisone, pseudoephedrine, naproxen sodium, oxycodone hcl er, polyethylene glycol, prochlorperazine, and sennosides, and the following Facility-Administered Medications:  cyclophosphamide (CYTOXAN) 1,420 mg in sodium chloride 0.9 % 250 mL chemo infusion, doxorubicin, heparin lock flush, ondansetron (ZOFRAN) 16 mg, dexamethasone (DECADRON) 20 mg in sodium chloride 0.9 % 50 mL IVPB, riTUXimab (RITUXAN) 700 mg in sodium chloride 0.9 % 250 mL chemo infusion, sodium chloride, and vinCRIStine (ONCOVIN) 1 mg in sodium chloride 0.9 % 50 mL chemo infusion.  SURGICAL HISTORY:  Past Surgical History  Procedure Laterality Date  . Portacath placement Right 04/27/13    FAMILY HISTORY: family history includes Cancer in his brother.  SOCIAL HISTORY:  reports that he has been smoking Cigarettes.  He has been smoking about 0.25 packs per day. He has never used smokeless tobacco. He reports that he does not drink alcohol or use illicit drugs.  REVIEW OF SYSTEMS:  Other than that discussed above is noncontributory.  PHYSICAL EXAMINATION: ECOG PERFORMANCE STATUS: 1 - Symptomatic but completely ambulatory  There were no vitals taken for this visit.  GENERAL:alert, no distress and comfortable SKIN: skin color, texture, turgor are normal, no rashes or significant lesions EYES: PERLA; Conjunctiva are pink and non-injected, sclera clear SINUSES: No redness or tenderness over maxillary or ethmoid sinuses. Bilateral rhinorrhea. No evidence of otitis. OROPHARYNX:no exudate, no erythema on lips, buccal mucosa, or tongue. NECK: supple, thyroid normal size, non-tender, without nodularity. No masses CHEST: Increased AP diameter with light port in place. LYMPH:  no palpable lymphadenopathy in the cervical, axillary or inguinal LUNGS: clear to auscultation and percussion with normal breathing effort HEART: regular rate & rhythm and no murmurs. ABDOMEN:abdomen soft, non-tender and normal bowel sounds MUSCULOSKELETAL:no cyanosis of digits and no clubbing. Range of motion normal.  NEURO: alert & oriented x 3 with fluent speech, no focal motor/sensory deficits   LABORATORY DATA: Infusion  on 07/23/2013  Component Date Value Ref Range Status  . WBC 07/23/2013 8.9  4.0 - 10.5 K/uL Final  . RBC 07/23/2013 3.99* 4.22 - 5.81 MIL/uL Final  . Hemoglobin 07/23/2013 11.9* 13.0 - 17.0 g/dL Final  . HCT 07/23/2013 36.2* 39.0 - 52.0 % Final  . MCV 07/23/2013 90.7  78.0 - 100.0 fL Final  . MCH 07/23/2013 29.8  26.0 - 34.0 pg Final  . MCHC 07/23/2013 32.9  30.0 - 36.0 g/dL Final  . RDW 07/23/2013 22.2* 11.5 - 15.5 % Final  . Platelets 07/23/2013 328  150 - 400 K/uL Final  . Neutrophils Relative % 07/23/2013 78* 43 - 77 % Final  . Neutro Abs 07/23/2013 7.0  1.7 - 7.7 K/uL Final  . Lymphocytes Relative 07/23/2013 10* 12 - 46 % Final  . Lymphs Abs 07/23/2013 0.9  0.7 - 4.0 K/uL Final  . Monocytes Relative 07/23/2013 11  3 - 12 % Final  . Monocytes Absolute 07/23/2013 1.0  0.1 - 1.0 K/uL Final  . Eosinophils Relative 07/23/2013 0  0 - 5 % Final  . Eosinophils Absolute 07/23/2013 0.0  0.0 - 0.7 K/uL Final  . Basophils Relative 07/23/2013 1  0 - 1 % Final  . Basophils Absolute 07/23/2013 0.1  0.0 - 0.1 K/uL Final  . Sodium 07/23/2013 142  137 - 147 mEq/L Final  . Potassium 07/23/2013 4.0  3.7 - 5.3 mEq/L Final  . Chloride 07/23/2013 103  96 - 112 mEq/L Final  . CO2 07/23/2013 28  19 - 32 mEq/L Final  . Glucose, Bld 07/23/2013 105* 70 - 99 mg/dL Final  . BUN 07/23/2013 11  6 - 23 mg/dL Final  . Creatinine, Ser 07/23/2013 0.75  0.50 - 1.35 mg/dL Final  . Calcium 07/23/2013 9.2  8.4 - 10.5 mg/dL Final  . Total Protein 07/23/2013 6.6  6.0 - 8.3 g/dL Final  . Albumin 07/23/2013 3.4* 3.5 - 5.2 g/dL Final  . AST 07/23/2013 14  0 - 37 U/L Final  . ALT 07/23/2013 8  0 - 53 U/L Final  . Alkaline Phosphatase 07/23/2013 109  39 - 117 U/L Final  . Total Bilirubin 07/23/2013 <0.2* 0.3 - 1.2 mg/dL Final  . GFR calc non Af Amer 07/23/2013 >90  >90 mL/min Final  . GFR calc Af Amer 07/23/2013 >90  >90 mL/min Final   Comment: (NOTE)                          The eGFR has been calculated using the CKD  EPI equation.                          This calculation has not been validated in all clinical situations.                          eGFR's persistently <90 mL/min signify possible Chronic Kidney                          Disease.  Marland Kitchen Uric Acid, Serum 07/23/2013 3.0* 4.0 - 7.8 mg/dL Final  Hospital Outpatient Visit on 07/16/2013  Component Date Value Ref Range Status  . Glucose-Capillary 07/16/2013 100* 70 - 99 mg/dL Final  Infusion on 07/02/2013  Component Date Value Ref Range Status  . WBC 07/02/2013 10.5  4.0 - 10.5 K/uL Final  . RBC 07/02/2013 3.99* 4.22 - 5.81 MIL/uL Final  . Hemoglobin 07/02/2013 11.5* 13.0 - 17.0 g/dL Final  .  HCT 07/02/2013 35.4* 39.0 - 52.0 % Final  . MCV 07/02/2013 88.7  78.0 - 100.0 fL Final  . MCH 07/02/2013 28.8  26.0 - 34.0 pg Final  . MCHC 07/02/2013 32.5  30.0 - 36.0 g/dL Final  . RDW 07/02/2013 22.5* 11.5 - 15.5 % Final  . Platelets 07/02/2013 291  150 - 400 K/uL Final  . Neutrophils Relative % 07/02/2013 80* 43 - 77 % Final  . Neutro Abs 07/02/2013 8.4* 1.7 - 7.7 K/uL Final  . Lymphocytes Relative 07/02/2013 9* 12 - 46 % Final  . Lymphs Abs 07/02/2013 1.0  0.7 - 4.0 K/uL Final  . Monocytes Relative 07/02/2013 10  3 - 12 % Final  . Monocytes Absolute 07/02/2013 1.1* 0.1 - 1.0 K/uL Final  . Eosinophils Relative 07/02/2013 0  0 - 5 % Final  . Eosinophils Absolute 07/02/2013 0.0  0.0 - 0.7 K/uL Final  . Basophils Relative 07/02/2013 1  0 - 1 % Final  . Basophils Absolute 07/02/2013 0.1  0.0 - 0.1 K/uL Final  . Sodium 07/02/2013 139  137 - 147 mEq/L Final  . Potassium 07/02/2013 4.2  3.7 - 5.3 mEq/L Final  . Chloride 07/02/2013 102  96 - 112 mEq/L Final  . CO2 07/02/2013 25  19 - 32 mEq/L Final  . Glucose, Bld 07/02/2013 137* 70 - 99 mg/dL Final  . BUN 07/02/2013 14  6 - 23 mg/dL Final  . Creatinine, Ser 07/02/2013 0.73  0.50 - 1.35 mg/dL Final  . Calcium 07/02/2013 9.1  8.4 - 10.5 mg/dL Final  . Total Protein 07/02/2013 6.2  6.0 - 8.3 g/dL Final  .  Albumin 07/02/2013 3.2* 3.5 - 5.2 g/dL Final  . AST 07/02/2013 14  0 - 37 U/L Final  . ALT 07/02/2013 6  0 - 53 U/L Final  . Alkaline Phosphatase 07/02/2013 99  39 - 117 U/L Final  . Total Bilirubin 07/02/2013 0.2* 0.3 - 1.2 mg/dL Final  . GFR calc non Af Amer 07/02/2013 >90  >90 mL/min Final  . GFR calc Af Amer 07/02/2013 >90  >90 mL/min Final   Comment: (NOTE)                          The eGFR has been calculated using the CKD EPI equation.                          This calculation has not been validated in all clinical situations.                          eGFR's persistently <90 mL/min signify possible Chronic Kidney                          Disease.  Marland Kitchen Uric Acid, Serum 07/02/2013 2.8* 4.0 - 7.8 mg/dL Final    PATHOLOGY: No new pathology.  Urinalysis No results found for this basename: colorurine,  appearanceur,  labspec,  phurine,  glucoseu,  hgbur,  bilirubinur,  ketonesur,  proteinur,  urobilinogen,  nitrite,  leukocytesur    RADIOGRAPHIC STUDIES: Nm Pet Image Restag (ps) Skull Base To Thigh  07/16/2013   CLINICAL DATA:  Subsequent treatment strategy for diffuse large B-cell lymphoma status post 4 cycles of chemotherapy.  EXAM: NUCLEAR MEDICINE PET SKULL BASE TO THIGH  TECHNIQUE: 8.1 mCi F-18 FDG was injected intravenously. Full-ring PET imaging  was performed from the skull base to thigh after the radiotracer. CT data was obtained and used for attenuation correction and anatomic localization.  FASTING BLOOD GLUCOSE:  Value: 100 mg/dl  COMPARISON:  04/12/2013  FINDINGS: NECK  A 1.1 cm right submandibular hypermetabolic lymph node is seen on image 35 with SUV max of 2.8. Sub-cm hypermetabolic lymph nodes are also seen in both jugular chains at stations 3 and 4, with SUV max of 3.5 on image 49. These are new since prior exam.  CHEST  A new sub-cm hypermetabolic lymph node is seen in the right subpectoral region with SUV max of 1.9 on image 60. Other hypermetabolic lymphadenopathy in the  right axilla, mediastinum, and bilateral hilar regions shows significant decrease since prior exam. Index lymph node in the right axilla currently measures 1.4 compared to 3.0 cm and has SUV max of 8.0 compared to 11.8 previously.  No suspicious pulmonary nodules seen by CT.  ABDOMEN/PELVIS  Hypermetabolic lesions seen in the inferior aspect of the spleen on previous study have completely resolved.  Hypermetabolic lymphadenopathy in the retroperitoneum in the peripancreatic region has significantly decreased since prior exam. Index adenopathy in the retrocaval space on image 136 currently has an SUV max of 5.7 compared to 12.1 previously.  There has also been significant decrease in hypermetabolic adenopathy in the right external iliac chain since prior exam. Index lymph node in this region currently measures 1.5 cm would SUV max of 5.2 on image 175 compared to 4.1 cm and SUV max of 13.3 on prior exam.  SKELETON  Multiple hypermetabolic lesions involving the proximal right femur and thigh muscles, pelvis, spine, left ribs and scapula, and right humeral head and adjacent shoulder muscles also show significant decrease since prior exam. Index lesions in the proximal right femur and thigh muscles have current SUV max of 4.1 compared to 12.7 previously.  IMPRESSION: Significant but incomplete metabolic response to therapy throughout the chest, abdomen, and pelvis.  Several small up to 1 cm hypermetabolic lymph nodes in the bilateral neck and right subpectoral region are new since previous exam.   Electronically Signed   By: Earle Gell M.D.   On: 07/16/2013 10:40    ASSESSMENT:  #1. Stage IV diffuse large B-cell lymphoma with right femur involvement manifesting as generalized lymphadenopathy and right lower extremity swelling with pain, excellent improvement with no analgesic requirement whatsoever but still with residual right femur discomfort without swelling.Marland Kitchen PET scan is still mildly positive with new areas of  involvement which I believe are related to his sinus problems. #2. Chronic obstructive pulmonary disease.  #3. Gastroesophageal reflux disease, controlled.  #4. Symptomatic allergic rhinitis    PLAN:  #1. R.-CHOP with rasburicast plus Neulasta cycle #5 today. #2. In the presence of his wife, he was told that treatment will be extended out to 8 cycles as a result of the incomplete PET scan findings. #3. Both the patient and his wife are in agreement with this strategy. #4. Followup in 3 weeks with cycle #6 of treatment. After 8 cycles of treatment, repeat PET scan will be performed to assess completeness of response.   All questions were answered. The patient knows to call the clinic with any problems, questions or concerns. We can certainly see the patient much sooner if necessary.   I spent 25 minutes counseling the patient face to face. The total time spent in the appointment was 30 minutes.    Doroteo Bradford, MD 07/23/2013 11:14 AM  DISCLAIMER:  This note was dictated with voice recognition software.  Similar sounding words can inadvertently be transcribed inaccurately and may not be corrected upon review.

## 2013-07-24 ENCOUNTER — Encounter (HOSPITAL_BASED_OUTPATIENT_CLINIC_OR_DEPARTMENT_OTHER): Payer: Medicare Other

## 2013-07-24 VITALS — BP 116/80 | HR 94 | Temp 98.2°F | Resp 18

## 2013-07-24 DIAGNOSIS — C8589 Other specified types of non-Hodgkin lymphoma, extranodal and solid organ sites: Secondary | ICD-10-CM

## 2013-07-24 DIAGNOSIS — Z5189 Encounter for other specified aftercare: Secondary | ICD-10-CM

## 2013-07-24 DIAGNOSIS — C8330 Diffuse large B-cell lymphoma, unspecified site: Secondary | ICD-10-CM

## 2013-07-24 MED ORDER — PEGFILGRASTIM INJECTION 6 MG/0.6ML
6.0000 mg | Freq: Once | SUBCUTANEOUS | Status: AC
  Administered 2013-07-24: 6 mg via SUBCUTANEOUS

## 2013-07-24 MED ORDER — PEGFILGRASTIM INJECTION 6 MG/0.6ML
SUBCUTANEOUS | Status: AC
  Filled 2013-07-24: qty 0.6

## 2013-07-24 NOTE — Progress Notes (Signed)
Devin Zuniga presents today for injection per MD orders. Neulasta 6mg  administered SQ in left Abdomen. Administration without incident. Patient tolerated well. Patient denies complaints post chemotherapy.

## 2013-07-30 ENCOUNTER — Other Ambulatory Visit (HOSPITAL_COMMUNITY): Payer: Self-pay | Admitting: *Deleted

## 2013-07-30 DIAGNOSIS — C8330 Diffuse large B-cell lymphoma, unspecified site: Secondary | ICD-10-CM

## 2013-07-30 MED ORDER — FLUCONAZOLE 200 MG PO TABS: 200.0000 mg | ORAL_TABLET | Freq: Every day | ORAL | Status: DC

## 2013-08-13 ENCOUNTER — Encounter (HOSPITAL_BASED_OUTPATIENT_CLINIC_OR_DEPARTMENT_OTHER): Payer: Medicare Other

## 2013-08-13 ENCOUNTER — Encounter (HOSPITAL_COMMUNITY): Payer: Medicare Other | Attending: Hematology and Oncology

## 2013-08-13 ENCOUNTER — Encounter (HOSPITAL_COMMUNITY): Payer: Self-pay

## 2013-08-13 VITALS — BP 98/70 | HR 72 | Temp 98.0°F | Resp 16 | Wt 149.0 lb

## 2013-08-13 DIAGNOSIS — B370 Candidal stomatitis: Secondary | ICD-10-CM

## 2013-08-13 DIAGNOSIS — C8589 Other specified types of non-Hodgkin lymphoma, extranodal and solid organ sites: Secondary | ICD-10-CM | POA: Insufficient documentation

## 2013-08-13 DIAGNOSIS — C8580 Other specified types of non-Hodgkin lymphoma, unspecified site: Secondary | ICD-10-CM

## 2013-08-13 DIAGNOSIS — Z5111 Encounter for antineoplastic chemotherapy: Secondary | ICD-10-CM

## 2013-08-13 DIAGNOSIS — Z5112 Encounter for antineoplastic immunotherapy: Secondary | ICD-10-CM

## 2013-08-13 DIAGNOSIS — C8330 Diffuse large B-cell lymphoma, unspecified site: Secondary | ICD-10-CM

## 2013-08-13 LAB — CBC WITH DIFFERENTIAL/PLATELET
BASOS PCT: 1 % (ref 0–1)
Basophils Absolute: 0.1 10*3/uL (ref 0.0–0.1)
EOS ABS: 0 10*3/uL (ref 0.0–0.7)
EOS PCT: 0 % (ref 0–5)
HEMATOCRIT: 33.6 % — AB (ref 39.0–52.0)
HEMOGLOBIN: 11.1 g/dL — AB (ref 13.0–17.0)
LYMPHS ABS: 2 10*3/uL (ref 0.7–4.0)
Lymphocytes Relative: 28 % (ref 12–46)
MCH: 30.4 pg (ref 26.0–34.0)
MCHC: 33 g/dL (ref 30.0–36.0)
MCV: 92.1 fL (ref 78.0–100.0)
MONO ABS: 1.1 10*3/uL — AB (ref 0.1–1.0)
MONOS PCT: 15 % — AB (ref 3–12)
Neutro Abs: 3.9 10*3/uL (ref 1.7–7.7)
Neutrophils Relative %: 56 % (ref 43–77)
Platelets: ADEQUATE 10*3/uL (ref 150–400)
RBC: 3.65 MIL/uL — AB (ref 4.22–5.81)
RDW: 19 % — ABNORMAL HIGH (ref 11.5–15.5)
WBC: 7 10*3/uL (ref 4.0–10.5)

## 2013-08-13 LAB — COMPREHENSIVE METABOLIC PANEL
ALT: 9 U/L (ref 0–53)
ANION GAP: 9 (ref 5–15)
AST: 17 U/L (ref 0–37)
Albumin: 3.2 g/dL — ABNORMAL LOW (ref 3.5–5.2)
Alkaline Phosphatase: 101 U/L (ref 39–117)
BUN: 15 mg/dL (ref 6–23)
CO2: 27 mEq/L (ref 19–32)
CREATININE: 0.84 mg/dL (ref 0.50–1.35)
Calcium: 9.4 mg/dL (ref 8.4–10.5)
Chloride: 101 mEq/L (ref 96–112)
GFR calc Af Amer: >90 mL/min (ref >90)
GFR calc non Af Amer: 89 mL/min — ABNORMAL LOW (ref >90)
GLUCOSE: 130 mg/dL — AB (ref 70–99)
Potassium: 4.3 mEq/L (ref 3.7–5.3)
Sodium: 137 mEq/L (ref 137–147)
TOTAL PROTEIN: 6.2 g/dL (ref 6.0–8.3)
Total Bilirubin: 0.2 mg/dL — ABNORMAL LOW (ref 0.3–1.2)

## 2013-08-13 LAB — URIC ACID: URIC ACID, SERUM: 2.5 mg/dL — AB (ref 4.0–7.8)

## 2013-08-13 MED ORDER — SODIUM CHLORIDE 0.9 % IV SOLN
Freq: Once | INTRAVENOUS | Status: AC
  Administered 2013-08-13: 10:00:00 via INTRAVENOUS

## 2013-08-13 MED ORDER — SODIUM CHLORIDE 0.9 % IJ SOLN
10.0000 mL | INTRAMUSCULAR | Status: DC | PRN
  Administered 2013-08-13: 10 mL

## 2013-08-13 MED ORDER — DOXORUBICIN HCL CHEMO IV INJECTION 2 MG/ML
50.0000 mg/m2 | Freq: Once | INTRAVENOUS | Status: AC
  Administered 2013-08-13: 96 mg via INTRAVENOUS
  Filled 2013-08-13: qty 48

## 2013-08-13 MED ORDER — HEPARIN SOD (PORK) LOCK FLUSH 100 UNIT/ML IV SOLN
500.0000 [IU] | Freq: Once | INTRAVENOUS | Status: AC | PRN
  Administered 2013-08-13: 500 [IU]
  Filled 2013-08-13: qty 5

## 2013-08-13 MED ORDER — SODIUM CHLORIDE 0.9 % IV SOLN
750.0000 mg/m2 | Freq: Once | INTRAVENOUS | Status: AC
  Administered 2013-08-13: 1420 mg via INTRAVENOUS
  Filled 2013-08-13: qty 71

## 2013-08-13 MED ORDER — VINCRISTINE SULFATE CHEMO INJECTION 1 MG/ML
1.0000 mg | Freq: Once | INTRAVENOUS | Status: AC
  Administered 2013-08-13: 1 mg via INTRAVENOUS
  Filled 2013-08-13: qty 1

## 2013-08-13 MED ORDER — PREDNISONE 20 MG PO TABS: ORAL_TABLET | ORAL | Status: AC

## 2013-08-13 MED ORDER — FLUCONAZOLE 200 MG PO TABS: 200.0000 mg | ORAL_TABLET | Freq: Every day | ORAL | Status: DC

## 2013-08-13 MED ORDER — SODIUM CHLORIDE 0.9 % IV SOLN
375.0000 mg/m2 | Freq: Once | INTRAVENOUS | Status: AC
  Administered 2013-08-13: 700 mg via INTRAVENOUS
  Filled 2013-08-13: qty 70

## 2013-08-13 MED ORDER — SODIUM CHLORIDE 0.9 % IV SOLN
Freq: Once | INTRAVENOUS | Status: AC
  Administered 2013-08-13: 16 mg via INTRAVENOUS
  Filled 2013-08-13: qty 8

## 2013-08-13 NOTE — Progress Notes (Signed)
[place]  OFFICE PROGRESS NOTE  [name], MD [address] [place] [address]  DIAGNOSIS: Diffuse large B cell lymphoma - Plan: NM Cardiac [place] Rest, fluconazole (DIFLUCAN) 200 MG tablet  Lymphoma malignant, large cell - Plan: predniSONE (DELTASONE) 20 MG tablet  Candidiasis of mouth  Chief Complaint  Patient presents with  . Lymphoma    CURRENT THERAPY: R.-CHOP every 3 weeks for cycle #6 today.   INTERVAL HISTORY: Devin Zuniga 68 y.o. 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 He developed thrush after his last treatment which cleared up with Diflucan but has returned after discontinuation of the agent. He denies any fever, night sweats, but does get constipated on occasion. He is using only ibuprofen for pain involving the right femur. Appetite is diminished recently. He denies any lower extremity swelling or redness, chest pain, PND, orthopnea, palpitations, skin rash, headache, or seizures.    MEDICAL HISTORY: Past Medical History  Diagnosis Date  . Cancer   . Diffuse large B cell lymphoma 04/24/2013    INTERIM HISTORY: has Piriformis syndrome of right side; Right hip pain; Back pain; Lymphadenopathy, generalized; Diffuse large B cell lymphoma; and Hypokalemia on his problem list.    ALLERGIES:  has No Known Allergies.  MEDICATIONS: has a current medication list which includes the following prescription(s): allopurinol, esomeprazole, ibuprofen, lidocaine-prilocaine, liniments, magnesium hydroxide, metoclopramide, naproxen sodium, ondansetron, potassium chloride sa, prednisone, capsaicin, first-dukes mouthwash, fluconazole, hydrocodone-acetaminophen, loratadine-pseudoephedrine, oxycodone hcl, oxycodone hcl er, polyethylene  glycol, prochlorperazine, pseudoephedrine, and sennosides, and the following Facility-Administered Medications: cyclophosphamide (CYTOXAN) 1,420 mg in sodium chloride 0.9 % 250 mL chemo infusion, doxorubicin, heparin lock flush, ondansetron (ZOFRAN) 16 mg, dexamethasone (DECADRON) 20 mg in sodium chloride 0.9 % 50 mL IVPB, riTUXimab (RITUXAN) 700 mg in sodium chloride 0.9 % 250 mL chemo infusion, sodium chloride, and vinCRIStine (ONCOVIN) 1 mg in sodium chloride 0.9 % 50 mL chemo infusion.  SURGICAL HISTORY:  Past Surgical History  Procedure Laterality Date  . Portacath placement Right 04/27/13    FAMILY HISTORY: family history includes Cancer in his brother.  SOCIAL HISTORY:  reports that he has been smoking Cigarettes.  He has been smoking about 0.25 packs per day. He has never used smokeless tobacco. He reports that he does not drink alcohol or use illicit drugs.  REVIEW OF SYSTEMS:  Other than that discussed above is noncontributory.  PHYSICAL EXAMINATION: ECOG PERFORMANCE STATUS: 1 - Symptomatic but completely ambulatory  There were no vitals taken for this visit.  GENERAL:alert, no distress and comfortable. Alopecia the scalp SKIN: skin color, texture, turgor are normal, no rashes or significant lesions EYES: PERLA; Conjunctiva are pink and non-injected, sclera clear SINUSES: No redness or tenderness over maxillary or ethmoid sinuses OROPHARYNX:no exudate, no erythema on lips, buccal mucosa, tongue with whitish, furry material NECK: supple, thyroid normal size, non-tender, without nodularity. No masses CHEST: Increased AP diameter with light port in place. No gynecomastia. LYMPH:  no palpable lymphadenopathy in the cervical, axillary or inguinal LUNGS: clear to auscultation and percussion with normal breathing effort HEART: regular rate & rhythm and no murmurs. ABDOMEN:abdomen soft, non-tender and normal bowel sounds MUSCULOSKELETAL:no cyanosis of digits and no clubbing. Range of  motion normal.  NEURO: alert & oriented x 3 with fluent  speech, no focal motor/sensory deficits   LABORATORY DATA: Infusion on 08/13/2013  Component Date Value Ref Range Status  . WBC 08/13/2013 7.0  4.0 - 10.5 K/uL Final  . RBC 08/13/2013 3.65* 4.22 - 5.81 MIL/uL Final  . Hemoglobin 08/13/2013 11.1* 13.0 - 17.0 g/dL Final  . HCT 08/13/2013 33.6* 39.0 - 52.0 % Final  . MCV 08/13/2013 92.1  78.0 - 100.0 fL Final  . MCH 08/13/2013 30.4  26.0 - 34.0 pg Final  . MCHC 08/13/2013 33.0  30.0 - 36.0 g/dL Final  . RDW 08/13/2013 19.0* 11.5 - 15.5 % Final  . Platelets 08/13/2013 PLATELET CLUMPS NOTED ON SMEAR, COUNT APPEARS ADEQUATE  150 - 400 K/uL Final  . Neutrophils Relative % 08/13/2013 56  43 - 77 % Final  . Neutro Abs 08/13/2013 3.9  1.7 - 7.7 K/uL Final  . Lymphocytes Relative 08/13/2013 28  12 - 46 % Final  . Lymphs Abs 08/13/2013 2.0  0.7 - 4.0 K/uL Final  . Monocytes Relative 08/13/2013 15* 3 - 12 % Final  . Monocytes Absolute 08/13/2013 1.1* 0.1 - 1.0 K/uL Final  . Eosinophils Relative 08/13/2013 0  0 - 5 % Final  . Eosinophils Absolute 08/13/2013 0.0  0.0 - 0.7 K/uL Final  . Basophils Relative 08/13/2013 1  0 - 1 % Final  . Basophils Absolute 08/13/2013 0.1  0.0 - 0.1 K/uL Final  . Sodium 08/13/2013 137  137 - 147 mEq/L Final  . Potassium 08/13/2013 4.3  3.7 - 5.3 mEq/L Final  . Chloride 08/13/2013 101  96 - 112 mEq/L Final  . CO2 08/13/2013 27  19 - 32 mEq/L Final  . Glucose, Bld 08/13/2013 130* 70 - 99 mg/dL Final  . BUN 08/13/2013 15  6 - 23 mg/dL Final  . Creatinine, Ser 08/13/2013 0.84  0.50 - 1.35 mg/dL Final  . Calcium 08/13/2013 9.4  8.4 - 10.5 mg/dL Final  . Total Protein 08/13/2013 6.2  6.0 - 8.3 g/dL Final  . Albumin 08/13/2013 3.2* 3.5 - 5.2 g/dL Final  . AST 08/13/2013 17  0 - 37 U/L Final  . ALT 08/13/2013 9  0 - 53 U/L Final  . Alkaline Phosphatase 08/13/2013 101  39 - 117 U/L Final  . Total Bilirubin 08/13/2013 0.2* 0.3 - 1.2 mg/dL Final  . GFR calc non Af  Amer 08/13/2013 89* >90 mL/min Final  . GFR calc Af Amer 08/13/2013 >90  >90 mL/min Final   Comment: (NOTE)                          The eGFR has been calculated using the CKD EPI equation.                          This calculation has not been validated in all clinical situations.                          eGFR's persistently <90 mL/min signify possible Chronic Kidney                          Disease.  . Anion gap 08/13/2013 9  5 - 15 Final  . Uric Acid, Serum 08/13/2013 2.5* 4.0 - 7.8 mg/dL Final  Infusion on 07/23/2013  Component Date Value Ref Range Status  . WBC 07/23/2013 8.9  4.0 - 10.5 K/uL Final  .  RBC 07/23/2013 3.99* 4.22 - 5.81 MIL/uL Final  . Hemoglobin 07/23/2013 11.9* 13.0 - 17.0 g/dL Final  . HCT 07/23/2013 36.2* 39.0 - 52.0 % Final  . MCV 07/23/2013 90.7  78.0 - 100.0 fL Final  . MCH 07/23/2013 29.8  26.0 - 34.0 pg Final  . MCHC 07/23/2013 32.9  30.0 - 36.0 g/dL Final  . RDW 07/23/2013 22.2* 11.5 - 15.5 % Final  . Platelets 07/23/2013 328  150 - 400 K/uL Final  . Neutrophils Relative % 07/23/2013 78* 43 - 77 % Final  . Neutro Abs 07/23/2013 7.0  1.7 - 7.7 K/uL Final  . Lymphocytes Relative 07/23/2013 10* 12 - 46 % Final  . Lymphs Abs 07/23/2013 0.9  0.7 - 4.0 K/uL Final  . Monocytes Relative 07/23/2013 11  3 - 12 % Final  . Monocytes Absolute 07/23/2013 1.0  0.1 - 1.0 K/uL Final  . Eosinophils Relative 07/23/2013 0  0 - 5 % Final  . Eosinophils Absolute 07/23/2013 0.0  0.0 - 0.7 K/uL Final  . Basophils Relative 07/23/2013 1  0 - 1 % Final  . Basophils Absolute 07/23/2013 0.1  0.0 - 0.1 K/uL Final  . Sodium 07/23/2013 142  137 - 147 mEq/L Final  . Potassium 07/23/2013 4.0  3.7 - 5.3 mEq/L Final  . Chloride 07/23/2013 103  96 - 112 mEq/L Final  . CO2 07/23/2013 28  19 - 32 mEq/L Final  . Glucose, Bld 07/23/2013 105* 70 - 99 mg/dL Final  . BUN 07/23/2013 11  6 - 23 mg/dL Final  . Creatinine, Ser 07/23/2013 0.75  0.50 - 1.35 mg/dL Final  . Calcium 07/23/2013 9.2  8.4  - 10.5 mg/dL Final  . Total Protein 07/23/2013 6.6  6.0 - 8.3 g/dL Final  . Albumin 07/23/2013 3.4* 3.5 - 5.2 g/dL Final  . AST 07/23/2013 14  0 - 37 U/L Final  . ALT 07/23/2013 8  0 - 53 U/L Final  . Alkaline Phosphatase 07/23/2013 109  39 - 117 U/L Final  . Total Bilirubin 07/23/2013 <0.2* 0.3 - 1.2 mg/dL Final  . GFR calc non Af Amer 07/23/2013 >90  >90 mL/min Final  . GFR calc Af Amer 07/23/2013 >90  >90 mL/min Final   Comment: (NOTE)                          The eGFR has been calculated using the CKD EPI equation.                          This calculation has not been validated in all clinical situations.                          eGFR's persistently <90 mL/min signify possible Chronic Kidney                          Disease.  Marland Kitchen Uric Acid, Serum 07/23/2013 3.0* 4.0 - 7.8 mg/dL Final  Hospital Outpatient Visit on 07/16/2013  Component Date Value Ref Range Status  . Glucose-Capillary 07/16/2013 100* 70 - 99 mg/dL Final    PATHOLOGY: No new pathology.  Urinalysis No results found for this basename: colorurine,  appearanceur,  labspec,  phurine,  glucoseu,  hgbur,  bilirubinur,  ketonesur,  proteinur,  urobilinogen,  nitrite,  leukocytesur    RADIOGRAPHIC STUDIES: Nm Pet Image Restag (ps) Skull  Base To Thigh  07/16/2013   CLINICAL DATA:  Subsequent treatment strategy for diffuse large B-cell lymphoma status post 4 cycles of chemotherapy.  EXAM: NUCLEAR MEDICINE PET SKULL BASE TO THIGH  TECHNIQUE: 8.1 mCi F-18 FDG was injected intravenously. Full-ring PET imaging was performed from the skull base to thigh after the radiotracer. CT data was obtained and used for attenuation correction and anatomic localization.  FASTING BLOOD GLUCOSE:  Value: 100 mg/dl  COMPARISON:  04/12/2013  FINDINGS: NECK  A 1.1 cm right submandibular hypermetabolic lymph node is seen on image 35 with SUV max of 2.8. Sub-cm hypermetabolic lymph nodes are also seen in both jugular chains at stations 3 and 4, with SUV max  of 3.5 on image 49. These are new since prior exam.  CHEST  A new sub-cm hypermetabolic lymph node is seen in the right subpectoral region with SUV max of 1.9 on image 60. Other hypermetabolic lymphadenopathy in the right axilla, mediastinum, and bilateral hilar regions shows significant decrease since prior exam. Index lymph node in the right axilla currently measures 1.4 compared to 3.0 cm and has SUV max of 8.0 compared to 11.8 previously.  No suspicious pulmonary nodules seen by CT.  ABDOMEN/PELVIS  Hypermetabolic lesions seen in the inferior aspect of the spleen on previous study have completely resolved.  Hypermetabolic lymphadenopathy in the retroperitoneum in the peripancreatic region has significantly decreased since prior exam. Index adenopathy in the retrocaval space on image 136 currently has an SUV max of 5.7 compared to 12.1 previously.  There has also been significant decrease in hypermetabolic adenopathy in the right external iliac chain since prior exam. Index lymph node in this region currently measures 1.5 cm would SUV max of 5.2 on image 175 compared to 4.1 cm and SUV max of 13.3 on prior exam.  SKELETON  Multiple hypermetabolic lesions involving the proximal right femur and thigh muscles, pelvis, spine, left ribs and scapula, and right humeral head and adjacent shoulder muscles also show significant decrease since prior exam. Index lesions in the proximal right femur and thigh muscles have current SUV max of 4.1 compared to 12.7 previously.  IMPRESSION: Significant but incomplete metabolic response to therapy throughout the chest, abdomen, and pelvis.  Several small up to 1 cm hypermetabolic lymph nodes in the bilateral neck and right subpectoral region are new since previous exam.   Electronically Signed   By: Earle Gell M.D.   On: 07/16/2013 10:40    ASSESSMENT:  #1. Stage IV diffuse large B-cell lymphoma with right femur involvement manifesting as generalized lymphadenopathy and right  lower extremity swelling with pain, excellent improvement with no analgesic requirement whatsoever but still with residual right femur discomfort without swelling.Marland Kitchen PET scan is still mildly positive with new areas of involvement which I believe are related to his sinus problems, for cycle #6 of chemotherapy today. #2. Chronic obstructive pulmonary disease.  #3. Gastroesophageal reflux disease, controlled.  #4. Symptomatic allergic rhinitis. #5. Persistent oral candidiasis.    PLAN:  #1. R. CHOP #6 today. #2. Diflucan 200 mg daily continuously. #3. Repeat MUGA scan in 2 weeks #4. Followup in 3 weeks to receive cycle #7 of a planned 8.   All questions were answered. The patient knows to call the clinic with any problems, questions or concerns. We can certainly see the patient much sooner if necessary.   I spent 25 minutes counseling the patient face to face. The total time spent in the appointment was 30 minutes.  Doroteo Bradford, MD 08/13/2013 10:59 AM  DISCLAIMER:  This note was dictated with voice recognition software.  Similar sounding words can inadvertently be transcribed inaccurately and may not be corrected upon review.

## 2013-08-13 NOTE — Patient Instructions (Addendum)
..  [facility] Discharge Instructions for Patients Receiving Chemotherapy  Today you received the following chemotherapy agents adriamycin, cytoxan, vincristine,  Dr. Barnet Glasgow has ordered the diflucan for you to take continously  We need to have you do a muga scan before next treatment To help prevent nausea and vomiting after your treatment, we encourage you to take your nausea medication as ordered If you develop nausea and vomiting, or diarrhea that is not controlled by your medication, call the clinic.  The clinic phone number is [phone]. Office hours are Monday-Friday 8:30am-5:00pm.  BELOW ARE SYMPTOMS THAT SHOULD BE REPORTED IMMEDIATELY:  *FEVER GREATER THAN 101.0 F  *CHILLS WITH OR WITHOUT FEVER  NAUSEA AND VOMITING THAT IS NOT CONTROLLED WITH YOUR NAUSEA MEDICATION  *UNUSUAL SHORTNESS OF BREATH  *UNUSUAL BRUISING OR BLEEDING  TENDERNESS IN MOUTH AND THROAT WITH OR WITHOUT PRESENCE OF ULCERS  *URINARY PROBLEMS  *BOWEL PROBLEMS  UNUSUAL RASH Items with * indicate a potential emergency and should be followed up as soon as possible. If you have an emergency after office hours please contact your primary care physician or go to the nearest emergency department.  Please call the clinic during office hours if you have any questions or concerns.   You may also contact the Patient Navigator at [phone] should you have any questions or need assistance in obtaining follow up care. _____________________________________________________________________ Have you asked about our STAR program?    STAR stands for Survivorship Training and Rehabilitation, and this is a nationally recognized cancer care program that focuses on survivorship and rehabilitation.  Cancer and cancer treatments may cause problems, such as, pain, making you feel tired and keeping you from doing the things that you need or want to do. Cancer rehabilitation can help. Our goal is to reduce  these troubling effects and help you have the best quality of life possible.  You may receive a survey from a nurse that asks questions about your current state of health.  Based on the survey results, all eligible patients will be referred to the [facility] program for an evaluation so we can better serve you! A frequently asked questions sheet is available upon request.

## 2013-08-13 NOTE — Progress Notes (Signed)
Noted on lab review that platelets clumps present, lab reports that platelet count appears adequate. Dr. Barnet Glasgow notified and ok given to proceed with treatment. Tolerated infusion well.

## 2013-08-14 ENCOUNTER — Encounter (HOSPITAL_BASED_OUTPATIENT_CLINIC_OR_DEPARTMENT_OTHER): Payer: Medicare Other

## 2013-08-14 VITALS — BP 106/81 | HR 103 | Temp 98.0°F | Resp 16

## 2013-08-14 DIAGNOSIS — C8589 Other specified types of non-Hodgkin lymphoma, extranodal and solid organ sites: Secondary | ICD-10-CM

## 2013-08-14 DIAGNOSIS — Z5189 Encounter for other specified aftercare: Secondary | ICD-10-CM

## 2013-08-14 DIAGNOSIS — C8330 Diffuse large B-cell lymphoma, unspecified site: Secondary | ICD-10-CM

## 2013-08-14 MED ORDER — PEGFILGRASTIM INJECTION 6 MG/0.6ML
SUBCUTANEOUS | Status: AC
  Filled 2013-08-14: qty 0.6

## 2013-08-14 MED ORDER — PEGFILGRASTIM INJECTION 6 MG/0.6ML
6.0000 mg | Freq: Once | SUBCUTANEOUS | Status: AC
  Administered 2013-08-14: 6 mg via SUBCUTANEOUS

## 2013-08-14 NOTE — Progress Notes (Signed)
Rubye Beach presents today for injection per MD orders. Neulasta 6mg  administered SQ in right Abdomen. Administration without incident. Patient tolerated well.

## 2013-08-17 ENCOUNTER — Other Ambulatory Visit (HOSPITAL_COMMUNITY): Payer: Self-pay | Admitting: Hematology and Oncology

## 2013-08-27 ENCOUNTER — Encounter (HOSPITAL_COMMUNITY): Payer: Self-pay

## 2013-08-27 ENCOUNTER — Encounter (HOSPITAL_COMMUNITY)
Admission: RE | Admit: 2013-08-27 | Discharge: 2013-08-27 | Disposition: A | Discharge status: Home / Self Care | Payer: Medicare Other | Source: Ambulatory Visit | Attending: Hematology and Oncology | Admitting: Hematology and Oncology

## 2013-08-27 DIAGNOSIS — C8589 Other specified types of non-Hodgkin lymphoma, extranodal and solid organ sites: Secondary | ICD-10-CM | POA: Diagnosis not present

## 2013-08-27 DIAGNOSIS — C8330 Diffuse large B-cell lymphoma, unspecified site: Secondary | ICD-10-CM

## 2013-08-27 MED ORDER — TECHNETIUM TC 99M-LABELED RED BLOOD CELLS IV KIT
25.0000 | PACK | Freq: Once | INTRAVENOUS | Status: AC | PRN
  Administered 2013-08-27: 27 via INTRAVENOUS

## 2013-08-27 MED ORDER — HEPARIN SOD (PORK) LOCK FLUSH 100 UNIT/ML IV SOLN
INTRAVENOUS | Status: AC
  Filled 2013-08-27: qty 5

## 2013-08-31 ENCOUNTER — Ambulatory Visit (HOSPITAL_COMMUNITY): Payer: Medicare Other

## 2013-09-03 ENCOUNTER — Encounter (HOSPITAL_COMMUNITY): Payer: Medicare Other | Attending: Hematology and Oncology

## 2013-09-03 ENCOUNTER — Encounter (HOSPITAL_BASED_OUTPATIENT_CLINIC_OR_DEPARTMENT_OTHER): Payer: Medicare Other

## 2013-09-03 VITALS — BP 95/61 | HR 72 | Temp 98.2°F | Resp 16 | Wt 146.4 lb

## 2013-09-03 VITALS — Ht 69.69 in | Wt 146.4 lb

## 2013-09-03 DIAGNOSIS — C8589 Other specified types of non-Hodgkin lymphoma, extranodal and solid organ sites: Secondary | ICD-10-CM | POA: Diagnosis present

## 2013-09-03 DIAGNOSIS — C8330 Diffuse large B-cell lymphoma, unspecified site: Secondary | ICD-10-CM

## 2013-09-03 DIAGNOSIS — C8588 Other specified types of non-Hodgkin lymphoma, lymph nodes of multiple sites: Secondary | ICD-10-CM

## 2013-09-03 DIAGNOSIS — J449 Chronic obstructive pulmonary disease, unspecified: Secondary | ICD-10-CM

## 2013-09-03 DIAGNOSIS — Z5112 Encounter for antineoplastic immunotherapy: Secondary | ICD-10-CM

## 2013-09-03 DIAGNOSIS — B370 Candidal stomatitis: Secondary | ICD-10-CM

## 2013-09-03 DIAGNOSIS — Z5111 Encounter for antineoplastic chemotherapy: Secondary | ICD-10-CM

## 2013-09-03 DIAGNOSIS — C8590 Non-Hodgkin lymphoma, unspecified, unspecified site: Secondary | ICD-10-CM

## 2013-09-03 LAB — CBC WITH DIFFERENTIAL/PLATELET
Basophils Absolute: 0.1 10*3/uL (ref 0.0–0.1)
Basophils Relative: 1 % (ref 0–1)
EOS ABS: 0 10*3/uL (ref 0.0–0.7)
Eosinophils Relative: 0 % (ref 0–5)
HEMATOCRIT: 33.8 % — AB (ref 39.0–52.0)
HEMOGLOBIN: 10.8 g/dL — AB (ref 13.0–17.0)
LYMPHS ABS: 2.6 10*3/uL (ref 0.7–4.0)
Lymphocytes Relative: 30 % (ref 12–46)
MCH: 30.5 pg (ref 26.0–34.0)
MCHC: 32 g/dL (ref 30.0–36.0)
MCV: 95.5 fL (ref 78.0–100.0)
MONOS PCT: 11 % (ref 3–12)
Monocytes Absolute: 0.9 10*3/uL (ref 0.1–1.0)
NEUTROS ABS: 5 10*3/uL (ref 1.7–7.7)
Neutrophils Relative %: 58 % (ref 43–77)
Platelets: 255 10*3/uL (ref 150–400)
RBC: 3.54 MIL/uL — ABNORMAL LOW (ref 4.22–5.81)
RDW: 19.6 % — ABNORMAL HIGH (ref 11.5–15.5)
WBC: 8.6 10*3/uL (ref 4.0–10.5)

## 2013-09-03 LAB — COMPREHENSIVE METABOLIC PANEL
ALK PHOS: 101 U/L (ref 39–117)
ALT: 10 U/L (ref 0–53)
ANION GAP: 11 (ref 5–15)
AST: 19 U/L (ref 0–37)
Albumin: 3.2 g/dL — ABNORMAL LOW (ref 3.5–5.2)
BILIRUBIN TOTAL: 0.2 mg/dL — AB (ref 0.3–1.2)
BUN: 12 mg/dL (ref 6–23)
CHLORIDE: 104 meq/L (ref 96–112)
CO2: 26 mEq/L (ref 19–32)
CREATININE: 0.76 mg/dL (ref 0.50–1.35)
Calcium: 9.2 mg/dL (ref 8.4–10.5)
GFR calc non Af Amer: >90 mL/min (ref >90)
Glucose, Bld: 110 mg/dL — ABNORMAL HIGH (ref 70–99)
Potassium: 4.3 mEq/L (ref 3.7–5.3)
Sodium: 141 mEq/L (ref 137–147)
Total Protein: 6.4 g/dL (ref 6.0–8.3)

## 2013-09-03 LAB — URIC ACID: Uric Acid, Serum: 2.6 mg/dL — ABNORMAL LOW (ref 4.0–7.8)

## 2013-09-03 MED ORDER — LACTATED RINGERS IV SOLN: 500.0000 mg/m2 | INTRAVENOUS | Status: DC

## 2013-09-03 MED ORDER — SODIUM CHLORIDE 0.9 % IV SOLN
Freq: Once | INTRAVENOUS | Status: AC
  Administered 2013-09-03: 10:00:00 via INTRAVENOUS

## 2013-09-03 MED ORDER — HEPARIN SOD (PORK) LOCK FLUSH 100 UNIT/ML IV SOLN
500.0000 [IU] | Freq: Once | INTRAVENOUS | Status: AC | PRN
  Administered 2013-09-03: 500 [IU]
  Filled 2013-09-03: qty 5

## 2013-09-03 MED ORDER — SODIUM CHLORIDE 0.9 % IV SOLN
10.0000 mg/m2 | Freq: Once | INTRAVENOUS | Status: AC
  Administered 2013-09-03: 20 mg via INTRAVENOUS
  Filled 2013-09-03: qty 10

## 2013-09-03 MED ORDER — SODIUM CHLORIDE 0.9 % IV SOLN
375.0000 mg/m2 | Freq: Once | INTRAVENOUS | Status: AC
  Administered 2013-09-03: 700 mg via INTRAVENOUS
  Filled 2013-09-03: qty 70

## 2013-09-03 MED ORDER — VINCRISTINE SULFATE CHEMO INJECTION 1 MG/ML
1.0000 mg | Freq: Once | INTRAVENOUS | Status: AC
  Administered 2013-09-03: 1 mg via INTRAVENOUS
  Filled 2013-09-03: qty 1

## 2013-09-03 MED ORDER — SODIUM CHLORIDE 0.9 % IV SOLN
750.0000 mg/m2 | Freq: Once | INTRAVENOUS | Status: AC
  Administered 2013-09-03: 1420 mg via INTRAVENOUS
  Filled 2013-09-03: qty 71

## 2013-09-03 MED ORDER — SODIUM CHLORIDE 0.9 % IJ SOLN
10.0000 mL | INTRAMUSCULAR | Status: AC | PRN
  Administered 2013-09-03: 10 mL

## 2013-09-03 MED ORDER — ONDANSETRON HCL 40 MG/20ML IJ SOLN
Freq: Once | INTRAMUSCULAR | Status: AC
  Administered 2013-09-03: 16 mg via INTRAVENOUS
  Filled 2013-09-03: qty 8

## 2013-09-03 MED ORDER — LACTATED RINGERS IV SOLN
500.0000 mg/m2 | Freq: Once | INTRAVENOUS | Status: AC
  Administered 2013-09-03: 910 mg via INTRAVENOUS
  Filled 2013-09-03: qty 91

## 2013-09-03 NOTE — Progress Notes (Signed)
[facility]  OFFICE PROGRESS NOTE  [name], MD [address] [place] [address]  DIAGNOSIS: No diagnosis found.  No chief complaint on file.   CURRENT THERAPY: R.-CHOP every 3 weeks x6. To receive R.-CNOP today because of drop in ejection fraction on last MUGA scan. .   INTERVAL HISTORY: Devin Zuniga 68 y.o. 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 Plan is to complete treatment with 8 cycles total using Novantrone after which repeat PET/CT scan will be done  Previously noted oral candidiasis cleared beautifully with Diflucan. He denies a sore throat now but still has some dysgeusia. He denies any diarrhea, constipation, melena, hematochezia, pain, lower extremity swelling or redness, PND, orthopnea, palpitations, skin rash, headache, dysuria, peripheral paresthesias, skin rash, headache, or seizures.  MEDICAL HISTORY: Past Medical History  Diagnosis Date  . Cancer   . Diffuse large B cell lymphoma 04/24/2013    INTERIM HISTORY: has Piriformis syndrome of right side; Right hip pain; Back pain; Lymphadenopathy, generalized; Diffuse large B cell lymphoma; and Hypokalemia on his problem list.    ALLERGIES:  has No Known Allergies.  MEDICATIONS: has a current medication list which includes the following prescription(s): allopurinol, capsaicin, first-dukes mouthwash, esomeprazole, fluconazole, hydrocodone-acetaminophen, ibuprofen, lidocaine-prilocaine, liniments, loratadine-pseudoephedrine, magnesium hydroxide, metoclopramide, naproxen sodium, ondansetron, oxycodone hcl, oxycodone hcl er, polyethylene glycol, potassium chloride sa, prednisone,  prochlorperazine, pseudoephedrine, and sennosides, and the following Facility-Administered Medications: sodium chloride, cyclophosphamide (CYTOXAN) 1,420 mg in sodium chloride 0.9 % 250 mL chemo infusion, heparin lock flush, mitoXANtrone (NOVANTRONE) 20 mg in sodium chloride 0.9 % 50 mL chemo infusion, ondansetron (ZOFRAN) 16 mg, dexamethasone (DECADRON) 20 mg in sodium chloride 0.9 % 50 mL IVPB, riTUXimab (RITUXAN) 700 mg in sodium chloride 0.9 % 250 mL chemo infusion, sodium chloride, and vinCRIStine (ONCOVIN) 1 mg in sodium chloride 0.9 % 50 mL chemo infusion.  SURGICAL HISTORY:  Past Surgical History  Procedure Laterality Date  . Portacath placement Right 04/27/13    FAMILY HISTORY: family history includes Cancer in his brother.  SOCIAL HISTORY:  reports that he has been smoking Cigarettes.  He has been smoking about 0.25 packs per day. He has never used smokeless tobacco. He reports that he does not drink alcohol or use illicit drugs.  REVIEW OF SYSTEMS:  Other than that discussed above is noncontributory.  PHYSICAL EXAMINATION: ECOG PERFORMANCE STATUS: 1 - Symptomatic but completely ambulatory  Height 5' 9.69" (1.77 m), weight 146 lb 6.2 oz (66.4 kg).  GENERAL:alert, no distress and comfortable SKIN: skin color, texture, turgor are normal, no rashes or significant lesions EYES: PERLA; Conjunctiva are pink and non-injected, sclera clear SINUSES: No redness or tenderness over maxillary or ethmoid sinuses OROPHARYNX:no exudate, no erythema on lips, buccal mucosa, or tongue. NECK: supple, thyroid normal size, non-tender, without nodularity. No masses CHEST: Increased AP diameter with light port in place. LYMPH:  no palpable lymphadenopathy in the cervical, axillary or inguinal LUNGS: clear to auscultation and percussion with normal breathing effort HEART: regular rate & rhythm and no murmurs. ABDOMEN:abdomen soft, non-tender and normal bowel sounds MUSCULOSKELETAL:no cyanosis of digits  and no clubbing. Range of motion normal.  NEURO: alert & oriented x 3 with fluent  speech, no focal motor/sensory deficits   LABORATORY DATA: Infusion on 09/03/2013  Component Date Value Ref Range Status  . WBC 09/03/2013 8.6  4.0 - 10.5 K/uL Final  . RBC 09/03/2013 3.54* 4.22 - 5.81 MIL/uL Final  . Hemoglobin 09/03/2013 10.8* 13.0 - 17.0 g/dL Final  . HCT 09/03/2013 33.8* 39.0 - 52.0 % Final  . MCV 09/03/2013 95.5  78.0 - 100.0 fL Final  . MCH 09/03/2013 30.5  26.0 - 34.0 pg Final  . MCHC 09/03/2013 32.0  30.0 - 36.0 g/dL Final  . RDW 09/03/2013 19.6* 11.5 - 15.5 % Final  . Platelets 09/03/2013 255  150 - 400 K/uL Final  . Neutrophils Relative % 09/03/2013 58  43 - 77 % Final  . Neutro Abs 09/03/2013 5.0  1.7 - 7.7 K/uL Final  . Lymphocytes Relative 09/03/2013 30  12 - 46 % Final  . Lymphs Abs 09/03/2013 2.6  0.7 - 4.0 K/uL Final  . Monocytes Relative 09/03/2013 11  3 - 12 % Final  . Monocytes Absolute 09/03/2013 0.9  0.1 - 1.0 K/uL Final  . Eosinophils Relative 09/03/2013 0  0 - 5 % Final  . Eosinophils Absolute 09/03/2013 0.0  0.0 - 0.7 K/uL Final  . Basophils Relative 09/03/2013 1  0 - 1 % Final  . Basophils Absolute 09/03/2013 0.1  0.0 - 0.1 K/uL Final  . Sodium 09/03/2013 141  137 - 147 mEq/L Final  . Potassium 09/03/2013 4.3  3.7 - 5.3 mEq/L Final  . Chloride 09/03/2013 104  96 - 112 mEq/L Final  . CO2 09/03/2013 26  19 - 32 mEq/L Final  . Glucose, Bld 09/03/2013 110* 70 - 99 mg/dL Final  . BUN 09/03/2013 12  6 - 23 mg/dL Final  . Creatinine, Ser 09/03/2013 0.76  0.50 - 1.35 mg/dL Final  . Calcium 09/03/2013 9.2  8.4 - 10.5 mg/dL Final  . Total Protein 09/03/2013 6.4  6.0 - 8.3 g/dL Final  . Albumin 09/03/2013 3.2* 3.5 - 5.2 g/dL Final  . AST 09/03/2013 19  0 - 37 U/L Final  . ALT 09/03/2013 10  0 - 53 U/L Final  . Alkaline Phosphatase 09/03/2013 101  39 - 117 U/L Final  . Total Bilirubin 09/03/2013 0.2* 0.3 - 1.2 mg/dL Final  . GFR calc non Af Amer 09/03/2013 >90  >90  mL/min Final  . GFR calc Af Amer 09/03/2013 >90  >90 mL/min Final   Comment: (NOTE)                          The eGFR has been calculated using the CKD EPI equation.                          This calculation has not been validated in all clinical situations.                          eGFR's persistently <90 mL/min signify possible Chronic Kidney                          Disease.  . Anion gap 09/03/2013 11  5 - 15 Final  . Uric Acid, Serum 09/03/2013 2.6* 4.0 - 7.8 mg/dL Final  Infusion on 08/13/2013  Component Date Value Ref Range Status  . WBC 08/13/2013 7.0  4.0 - 10.5 K/uL Final  . RBC 08/13/2013 3.65*  4.22 - 5.81 MIL/uL Final  . Hemoglobin 08/13/2013 11.1* 13.0 - 17.0 g/dL Final  . HCT 08/13/2013 33.6* 39.0 - 52.0 % Final  . MCV 08/13/2013 92.1  78.0 - 100.0 fL Final  . MCH 08/13/2013 30.4  26.0 - 34.0 pg Final  . MCHC 08/13/2013 33.0  30.0 - 36.0 g/dL Final  . RDW 08/13/2013 19.0* 11.5 - 15.5 % Final  . Platelets 08/13/2013 PLATELET CLUMPS NOTED ON SMEAR, COUNT APPEARS ADEQUATE  150 - 400 K/uL Final  . Neutrophils Relative % 08/13/2013 56  43 - 77 % Final  . Neutro Abs 08/13/2013 3.9  1.7 - 7.7 K/uL Final  . Lymphocytes Relative 08/13/2013 28  12 - 46 % Final  . Lymphs Abs 08/13/2013 2.0  0.7 - 4.0 K/uL Final  . Monocytes Relative 08/13/2013 15* 3 - 12 % Final  . Monocytes Absolute 08/13/2013 1.1* 0.1 - 1.0 K/uL Final  . Eosinophils Relative 08/13/2013 0  0 - 5 % Final  . Eosinophils Absolute 08/13/2013 0.0  0.0 - 0.7 K/uL Final  . Basophils Relative 08/13/2013 1  0 - 1 % Final  . Basophils Absolute 08/13/2013 0.1  0.0 - 0.1 K/uL Final  . Sodium 08/13/2013 137  137 - 147 mEq/L Final  . Potassium 08/13/2013 4.3  3.7 - 5.3 mEq/L Final  . Chloride 08/13/2013 101  96 - 112 mEq/L Final  . CO2 08/13/2013 27  19 - 32 mEq/L Final  . Glucose, Bld 08/13/2013 130* 70 - 99 mg/dL Final  . BUN 08/13/2013 15  6 - 23 mg/dL Final  . Creatinine, Ser 08/13/2013 0.84  0.50 - 1.35 mg/dL Final  .  Calcium 08/13/2013 9.4  8.4 - 10.5 mg/dL Final  . Total Protein 08/13/2013 6.2  6.0 - 8.3 g/dL Final  . Albumin 08/13/2013 3.2* 3.5 - 5.2 g/dL Final  . AST 08/13/2013 17  0 - 37 U/L Final  . ALT 08/13/2013 9  0 - 53 U/L Final  . Alkaline Phosphatase 08/13/2013 101  39 - 117 U/L Final  . Total Bilirubin 08/13/2013 0.2* 0.3 - 1.2 mg/dL Final  . GFR calc non Af Amer 08/13/2013 89* >90 mL/min Final  . GFR calc Af Amer 08/13/2013 >90  >90 mL/min Final   Comment: (NOTE)                          The eGFR has been calculated using the CKD EPI equation.                          This calculation has not been validated in all clinical situations.                          eGFR's persistently <90 mL/min signify possible Chronic Kidney                          Disease.  . Anion gap 08/13/2013 9  5 - 15 Final  . Uric Acid, Serum 08/13/2013 2.5* 4.0 - 7.8 mg/dL Final    PATHOLOGY: No new pathology.  Urinalysis No results found for this basename: colorurine,  appearanceur,  labspec,  phurine,  glucoseu,  hgbur,  bilirubinur,  ketonesur,  proteinur,  urobilinogen,  nitrite,  leukocytesur    RADIOGRAPHIC STUDIES: Nm Cardiac Muga Rest  08/27/2013   CLINICAL DATA:  On chemotherapy.  EXAM:  NUCLEAR MEDICINE CARDIAC BLOOD POOL IMAGING (MUGA)  TECHNIQUE: Cardiac multi-gated acquisition was performed at rest following intravenous injection of Tc-55mlabeled red blood cells.  RADIOPHARMACEUTICALS:  27.0 [id number]-vitro labeled red blood cells.  COMPARISON:  None.  FINDINGS: The left ventricular ejection fraction is equal to 54.5%. On the previous examination the ejection fraction was equal to 64%. Normal left ventricular wall motion.  IMPRESSION: The left ventricular ejection fraction is equal to 55%. This is decreased from 64% previously. Findings represent a 14% interval decline in ejection fraction.   Electronically Signed   By: TaKerby Moors.D.   On: 08/27/2013 16:17    ASSESSMENT:  #1. Stage IV diffuse  large B-cell lymphoma with right femur involvement manifesting as generalized lymphadenopathy and right lower extremity swelling with pain, excellent improvement with no analgesic requirement whatsoever but still with residual right femur discomfort without swelling.. Marland KitchenET scan is still mildly positive with new areas of involvement which I believe are related to his sinus problems, for additional chemotherapy day substituting mitoxantrone for doxorubicin for this cycle as well as cycle #8 due to drop in ejection fraction. Will use prophylactic therapy with Zinecard for last 2 cycles. #2. Chronic obstructive pulmonary disease.  #3. Gastroesophageal reflux disease, controlled.  #4. Symptomatic allergic rhinitis.  #5. Persistent oral candidiasis, controlled with continuous Diflucan #6. Dysgeusia    PLAN:  #1. R.-CNOP IV today followed by Neulasta subcutaneously tomorrow. Senokot 500 mg per meter squared will be added today and in conjunction with his last treatment in order to prevent further cardiac damage. #2. Zinc supplements 50 mg twice a day to help with dysgeusia. #3. Diflucan continue 200 mg daily. #4. Dissolve potassium chloride tablets and water before taking. #5. Decreased ejection fraction with doxorubicin: Prophylactic Zinecard #6. Followup in 3 weeks to receive final cycle of chemotherapy 2 weeks after which repeat PET scan will be done.   All questions were answered. The patient knows to call the clinic with any problems, questions or concerns. We can certainly see the patient much sooner if necessary.   I spent 25 minutes counseling the patient face to face. The total time spent in the appointment was 30 minutes.    FoDoroteo BradfordMD 09/03/2013 11:02 AM  DISCLAIMER:  This note was dictated with voice recognition software.  Similar sounding words can inadvertently be transcribed inaccurately and may not be corrected upon review.

## 2013-09-03 NOTE — Patient Instructions (Signed)
..  [facility] Discharge Instructions for Patients Receiving Chemotherapy  Today you received the following chemotherapy agents mitoxantrone, vincristine, cytoxan and rituxan.  We stopped the adriamycin due to cardiac toxicity and added a drug that is a cardiac protector. Take diflucan throughout your chemotherapy cycles Take zinc 50mg  twice a day to also help with your mouth  Try dissolving your potassium in water. One more cycle then PET scan.  To help prevent nausea and vomiting after your treatment, we encourage you to take your nausea medication as scheduled If you develop nausea and vomiting, or diarrhea that is not controlled by your medication, call the clinic.  The clinic phone number is [phone]. Office hours are Monday-Friday 8:30am-5:00pm.  BELOW ARE SYMPTOMS THAT SHOULD BE REPORTED IMMEDIATELY:  *FEVER GREATER THAN 101.0 F  *CHILLS WITH OR WITHOUT FEVER  NAUSEA AND VOMITING THAT IS NOT CONTROLLED WITH YOUR NAUSEA MEDICATION  *UNUSUAL SHORTNESS OF BREATH  *UNUSUAL BRUISING OR BLEEDING  TENDERNESS IN MOUTH AND THROAT WITH OR WITHOUT PRESENCE OF ULCERS  *URINARY PROBLEMS  *BOWEL PROBLEMS  UNUSUAL RASH Items with * indicate a potential emergency and should be followed up as soon as possible. If you have an emergency after office hours please contact your primary care physician or go to the nearest emergency department.  Please call the clinic during office hours if you have any questions or concerns.   You may also contact the Patient Navigator at [phone] should you have any questions or need assistance in obtaining follow up care. _____________________________________________________________________ Have you asked about our STAR program?    STAR stands for Survivorship Training and Rehabilitation, and this is a nationally recognized cancer care program that focuses on survivorship and rehabilitation.  Cancer and cancer treatments may  cause problems, such as, pain, making you feel tired and keeping you from doing the things that you need or want to do. Cancer rehabilitation can help. Our goal is to reduce these troubling effects and help you have the best quality of life possible.  You may receive a survey from a nurse that asks questions about your current state of health.  Based on the survey results, all eligible patients will be referred to the [facility] program for an evaluation so we can better serve you! A frequently asked questions sheet is available upon request.

## 2013-09-03 NOTE — Progress Notes (Unsigned)
Tolerated well

## 2013-09-04 ENCOUNTER — Other Ambulatory Visit (HOSPITAL_COMMUNITY): Payer: Self-pay | Admitting: Hematology and Oncology

## 2013-09-04 ENCOUNTER — Encounter (HOSPITAL_BASED_OUTPATIENT_CLINIC_OR_DEPARTMENT_OTHER): Payer: Medicare Other

## 2013-09-04 DIAGNOSIS — Z5189 Encounter for other specified aftercare: Secondary | ICD-10-CM

## 2013-09-04 DIAGNOSIS — C8330 Diffuse large B-cell lymphoma, unspecified site: Secondary | ICD-10-CM

## 2013-09-04 DIAGNOSIS — C8588 Other specified types of non-Hodgkin lymphoma, lymph nodes of multiple sites: Secondary | ICD-10-CM

## 2013-09-04 MED ORDER — PEGFILGRASTIM INJECTION 6 MG/0.6ML
6.0000 mg | Freq: Once | SUBCUTANEOUS | Status: DC
Start: 2013-09-04 — End: 2013-09-04

## 2013-09-04 MED ORDER — PEGFILGRASTIM INJECTION 6 MG/0.6ML
SUBCUTANEOUS | Status: AC
  Filled 2013-09-04: qty 0.6

## 2013-09-04 MED ORDER — PEGFILGRASTIM INJECTION 6 MG/0.6ML
6.0000 mg | Freq: Once | SUBCUTANEOUS | Status: AC
  Administered 2013-09-04: 6 mg via SUBCUTANEOUS

## 2013-09-04 NOTE — Progress Notes (Signed)
Devin Zuniga presents today for injection per the provider's orders.  Neulasta administration without incident; see MAR for injection details.  Patient tolerated procedure well and without incident.  No questions or complaints noted at this time.

## 2013-09-04 NOTE — Patient Instructions (Signed)
Mitoxantrone injection What is this medicine? MITOXANTRONE (MYE toe ZAN trone) is a chemotherapy drug. It targets fast dividing cells, like cancer cells, and causes these cells to die. This medicine is used to treat acute nonlymphocytic leukemia (ANLL) and advanced prostate cancer. It is also used to treat certain types of multiple sclerosis. This medicine may be used for other purposes; ask your health care provider or pharmacist if you have questions. COMMON BRAND NAME(S): Novantrone What should I tell my health care provider before I take this medicine? They need to know if you have any of these conditions: -heart disease -infection (especially virus infection such as chickenpox or herpes) -liver disease -low blood counts, like low platelets, red blood cells, white blood cells -previous chemotherapy, especially with doxorubicin, daunorubicin, epirubicin, or idarubicin -recent or ongoing radiation therapy -an unusual or allergic reaction to mitoxantrone, other medicines, foods, dyes, or preservatives -pregnant or are trying to get pregnant -breast-feeding How should I use this medicine? This drug is given as an infusion into a vein. It is administered in a hospital or clinic by a specially trained health care professional. If you have pain, swelling, burning or any unusual feeling around the site of your injection, tell your health care professional right away. Talk to your pediatrician regarding the use of this medicine in children. Special care may be needed. Overdosage: If you think you have taken too much of this medicine contact a poison control center or emergency room at once. NOTE: This medicine is only for you. Do not share this medicine with others. What if I miss a dose? It is important not to miss your dose. Call your doctor or health care professional if you are unable to keep an appointment. What may interact with this medicine? -ciprofloxacin -cyclosporine -medicines to  increase blood counts like filgrastim, pegfilgrastim, sargramostim -other chemotherapy drugs like daunorubicin, doxorubicin, epirubicin, idarubicin, trastuzumab -vaccines Talk to your doctor or health care professional before taking any of these medicines: -acetaminophen -aspirin -ibuprofen -ketoprofen -naproxen This list may not describe all possible interactions. Give your health care provider a list of all the medicines, herbs, non-prescription drugs, or dietary supplements you use. Also tell them if you smoke, drink alcohol, or use illegal drugs. Some items may interact with your medicine. What should I watch for while using this medicine? Your condition will be monitored carefully while you are receiving this medicine. You will need important blood work done while you are taking this medicine. This drug may make you feel generally unwell. This is not uncommon, as chemotherapy can affect healthy cells as well as cancer cells. Report any side effects. Continue your course of treatment even though you feel ill unless your doctor tells you to stop. Call your doctor or health care professional for advice if you get a fever, chills or sore throat, or other symptoms of a cold or flu. Do not treat yourself. This drug decreases your body's ability to fight infections. Try to avoid being around people who are sick. This medicine may increase your risk to bruise or bleed. Call your doctor or health care professional if you notice any unusual bleeding. Be careful brushing and flossing your teeth or using a toothpick because you may get an infection or bleed more easily. If you have any dental work done, tell your dentist you are receiving this medicine. Avoid taking products that contain aspirin, acetaminophen, ibuprofen, naproxen, or ketoprofen unless instructed by your doctor. These medicines may hide a fever. Your urine may  turn blue-green for a few days after your dose. This is normal with this  medicine. Do not become pregnant while taking this medicine. Women should inform their doctor if they wish to become pregnant or think they might be pregnant. There is a potential for serious side effects to an unborn child. Take a pregnancy test as directed before each dose of this medicine. Talk to your health care professional or pharmacist for more information. Do not breast-feed an infant while taking this medicine. There is a maximum amount of this medicine you should receive throughout your life. The amount depends on the medical condition being treated and your overall health. Your doctor will watch how much of this medicine you receive in your lifetime. Tell your doctor if you have taken this medicine before. What side effects may I notice from receiving this medicine? Side effects that you should report to your doctor or health care professional as soon as possible: -allergic reactions like skin rash, itching or hives, swelling of the face, lips, or tongue -low blood counts - this medicine may decrease the number of white blood cells, red blood cells and platelets. You may be at increased risk for infections and bleeding. -signs of infection - fever or chills, cough, sore throat, pain or difficulty passing urine -signs of decreased platelets or bleeding - bruising, pinpoint red spots on the skin, black, tarry stools, blood in the urine -signs of decreased red blood cells - unusually weak or tired, fainting spells, lightheadedness -breathing problems -changes in vision -chest pain -fast, irregular heartbeat -mouth sores -nausea, vomiting -pain, swelling, redness at site where injected -swelling of the ankles, feet, hands -yellowing of the eyes or skin Side effects that usually do not require medical attention (report to your doctor or health care professional if they continue or are bothersome): -blue color in the whites of your eyes -constipation -diarrhea -hair loss -loss of  appetite -missed menstrual periods -nail discoloration or damage -stomach upset This list may not describe all possible side effects. Call your doctor for medical advice about side effects. You may report side effects to FDA at 1-800-FDA-1088. Where should I keep my medicine? This drug is given in a hospital or clinic and will not be stored at home. NOTE: This sheet is a summary. It may not cover all possible information. If you have questions about this medicine, talk to your doctor, pharmacist, or health care provider.  2015, [employer]vier/[employer] Standard. (2012-05-16 11:52:56) Dexrazoxane injection What is this medicine? DEXRAZOXANE (dex ray ZOX ane) is used to protect against damage caused by certain chemotherapy. This medicine may be used for other purposes; ask your health care provider or pharmacist if you have questions. COMMON BRAND NAME(S): Zinecard What should I tell my health care provider before I take this medicine? They need to know if you have any of these conditions: -bone marrow suppression -heart disease -kidney disease -an unusual or allergic reaction to dexrazoxane, other medicines, foods, dyes, or preservatives -pregnant or trying to get pregnant -breast-feeding How should I use this medicine? This medicine is for injection or infusion into a vein. It is given by a health care professional in a hospital or clinic setting. This medicine is given just before you are given your chemotherapy medicine. Talk to your pediatrician regarding the use of this medicine in children. Special care may be needed. Overdosage: If you think you have taken too much of this medicine contact a poison control center or emergency room at once. NOTE: This  medicine is only for you. Do not share this medicine with others. What if I miss a dose? This does not apply. What may interact with this medicine? Interactions are not expected. This list may not describe all possible interactions. Give your  health care provider a list of all the medicines, herbs, non-prescription drugs, or dietary supplements you use. Also tell them if you smoke, drink alcohol, or use illegal drugs. Some items may interact with your medicine. What should I watch for while using this medicine? Your condition will be monitored carefully while you are receiving this medicine. This medicine may increase your risk of getting an infection. Stay away from people who are sick and anyone who has recently had a vaccine. Call your doctor or health care professional for advice if you get a fever, chills, or sore throat. Do not treat yourself. This medicine may increase your risk to bruise or bleed. Call your doctor or health care professional if you notice any unusual bleeding. What side effects may I notice from receiving this medicine? Side effects that you should report to your doctor or health care professional as soon as possible: -allergic reactions like skin rash, itching or hives, swelling of the face, lips, or tongue -fever, chills, or sore throat -mouth sores -pain at site where injected -unusual bleeding or bruising -unusually tired or weak -vomiting Side effects that usually do not require medical attention (report to your doctor or health care professional if they continue or are bothersome): -confusion -depression -diarrhea -hair loss -nausea This list may not describe all possible side effects. Call your doctor for medical advice about side effects. You may report side effects to FDA at 1-800-FDA-1088. Where should I keep my medicine? This drug is given in a hospital or clinic and will not be stored at home. NOTE: This sheet is a summary. It may not cover all possible information. If you have questions about this medicine, talk to your doctor, pharmacist, or health care provider.  2015, [employer]/Gold Standard. (2007-05-11 17:38:56) Dexrazoxane injection What is this medicine? DEXRAZOXANE (dex ray ZOX ane)  is used to protect against damage caused by certain chemotherapy. This medicine may be used for other purposes; ask your health care provider or pharmacist if you have questions. COMMON BRAND NAME(S): Zinecard What should I tell my health care provider before I take this medicine? They need to know if you have any of these conditions: -bone marrow suppression -heart disease -kidney disease -an unusual or allergic reaction to dexrazoxane, other medicines, foods, dyes, or preservatives -pregnant or trying to get pregnant -breast-feeding How should I use this medicine? This medicine is for injection or infusion into a vein. It is given by a health care professional in a hospital or clinic setting. This medicine is given just before you are given your chemotherapy medicine. Talk to your pediatrician regarding the use of this medicine in children. Special care may be needed. Overdosage: If you think you have taken too much of this medicine contact a poison control center or emergency room at once. NOTE: This medicine is only for you. Do not share this medicine with others. What if I miss a dose? This does not apply. What may interact with this medicine? Interactions are not expected. This list may not describe all possible interactions. Give your health care provider a list of all the medicines, herbs, non-prescription drugs, or dietary supplements you use. Also tell them if you smoke, drink alcohol, or use illegal drugs. Some items may interact  with your medicine. What should I watch for while using this medicine? Your condition will be monitored carefully while you are receiving this medicine. This medicine may increase your risk of getting an infection. Stay away from people who are sick and anyone who has recently had a vaccine. Call your doctor or health care professional for advice if you get a fever, chills, or sore throat. Do not treat yourself. This medicine may increase your risk to  bruise or bleed. Call your doctor or health care professional if you notice any unusual bleeding. What side effects may I notice from receiving this medicine? Side effects that you should report to your doctor or health care professional as soon as possible: -allergic reactions like skin rash, itching or hives, swelling of the face, lips, or tongue -fever, chills, or sore throat -mouth sores -pain at site where injected -unusual bleeding or bruising -unusually tired or weak -vomiting Side effects that usually do not require medical attention (report to your doctor or health care professional if they continue or are bothersome): -confusion -depression -diarrhea -hair loss -nausea This list may not describe all possible side effects. Call your doctor for medical advice about side effects. You may report side effects to FDA at 1-800-FDA-1088. Where should I keep my medicine? This drug is given in a hospital or clinic and will not be stored at home. NOTE: This sheet is a summary. It may not cover all possible information. If you have questions about this medicine, talk to your doctor, pharmacist, or health care provider.  2015, Elsevier/Gold Standard. (2007-05-11 17:38:56)

## 2013-09-22 ENCOUNTER — Emergency Department (HOSPITAL_COMMUNITY)
Admission: EM | Admit: 2013-09-22 | Discharge: 2013-09-22 | Disposition: A | Discharge status: Home / Self Care | Payer: Medicare Other | Attending: Emergency Medicine | Admitting: Emergency Medicine

## 2013-09-22 ENCOUNTER — Emergency Department (HOSPITAL_COMMUNITY): Payer: Medicare Other

## 2013-09-22 ENCOUNTER — Encounter (HOSPITAL_COMMUNITY): Payer: Self-pay | Admitting: Emergency Medicine

## 2013-09-22 DIAGNOSIS — R110 Nausea: Secondary | ICD-10-CM | POA: Insufficient documentation

## 2013-09-22 DIAGNOSIS — C8590 Non-Hodgkin lymphoma, unspecified, unspecified site: Secondary | ICD-10-CM

## 2013-09-22 DIAGNOSIS — IMO0002 Reserved for concepts with insufficient information to code with codable children: Secondary | ICD-10-CM | POA: Insufficient documentation

## 2013-09-22 DIAGNOSIS — Z79899 Other long term (current) drug therapy: Secondary | ICD-10-CM | POA: Insufficient documentation

## 2013-09-22 DIAGNOSIS — F17200 Nicotine dependence, unspecified, uncomplicated: Secondary | ICD-10-CM | POA: Diagnosis not present

## 2013-09-22 DIAGNOSIS — K5900 Constipation, unspecified: Secondary | ICD-10-CM | POA: Insufficient documentation

## 2013-09-22 DIAGNOSIS — Z87898 Personal history of other specified conditions: Secondary | ICD-10-CM | POA: Diagnosis not present

## 2013-09-22 DIAGNOSIS — M25559 Pain in unspecified hip: Secondary | ICD-10-CM | POA: Insufficient documentation

## 2013-09-22 DIAGNOSIS — M25551 Pain in right hip: Secondary | ICD-10-CM

## 2013-09-22 MED ORDER — OXYCODONE-ACETAMINOPHEN 5-325 MG PO TABS: ORAL_TABLET | ORAL | Status: AC

## 2013-09-22 MED ORDER — OXYCODONE-ACETAMINOPHEN 5-325 MG PO TABS: ORAL_TABLET | ORAL | Status: DC

## 2013-09-22 MED ORDER — OXYCODONE-ACETAMINOPHEN 5-325 MG PO TABS
1.0000 | ORAL_TABLET | Freq: Once | ORAL | Status: AC
  Administered 2013-09-22: 1 via ORAL
  Filled 2013-09-22: qty 1

## 2013-09-22 NOTE — ED Notes (Signed)
Pt c/o pain to right buttock/hip region that started Wednesday after he slept on his back, pain is better with standing, denies any injury, denies any back injury.

## 2013-09-22 NOTE — ED Provider Notes (Signed)
CSN: [id number]     Arrival date & time 09/22/13  1647 History   First MD Initiated Contact with Patient 09/22/13 1727     Chief Complaint  Patient presents with  . Hip Pain     (Consider location/radiation/quality/duration/timing/severity/associated sxs/prior Treatment) HPI Comments: The patient is a 68 year old male who presents to the emergency department with right hip and buttocks area pain. The patient unfortunately suffers from lymphoma. He has had problems with his back and hip area for quite some time. He is currently receiving cancer treatments here at the [facility]. The patient states that he has been doing well, been able to control most of his discomfort with ibuprofen. He is concerned about having very strong medications in his home as he lives mostly by himself. He had been prescribed OxyContin, as well as oxycodone 10 mg. After he had not use these medications for period of time, he disposed of them at the police department. He now has pain in his hip and back area that at times keeps him up, and he does not have any medication to use for pain. The patient denies any recent fall or injury. He states that he does not usually sleep on his back, and he recently slept on his back and that seems to have been when this problem was exacerbated. He has not been doing any heavy lifting, pushing or pulling. The patient presents now for evaluation of this pain, and assistance with his discomfort.  Patient is a 68 y.o. male presenting with hip pain. The history is provided by the patient.  Hip Pain This is a recurrent problem. Associated symptoms include arthralgias and nausea. Pertinent negatives include no abdominal pain, chest pain, coughing or neck pain.    Past Medical History  Diagnosis Date  . Cancer   . 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 Portacath placement Right 04/27/13   Family History  Problem Relation Age of  Onset  . Cancer Brother    History  Substance Use Topics  . Smoking status: Current Every Day Smoker -- 0.25 packs/day    Types: Cigarettes  . Smokeless tobacco: Never Used  . Alcohol Use: No    Review of Systems  Constitutional: Negative for activity change.       All ROS Neg except as noted in HPI  HENT: Negative for nosebleeds.   Eyes: Negative for photophobia and discharge.  Respiratory: Negative for cough, shortness of breath and wheezing.   Cardiovascular: Negative for chest pain and palpitations.  Gastrointestinal: Positive for nausea and constipation. Negative for abdominal pain and blood in stool.  Genitourinary: Negative for dysuria, frequency and hematuria.  Musculoskeletal: Positive for arthralgias. Negative for back pain and neck pain.  Skin: Negative.   Neurological: Negative for dizziness, seizures and speech difficulty.  Psychiatric/Behavioral: Negative for hallucinations and confusion.      Allergies  Review of patient's allergies indicates no known allergies.  Home Medications   Prior to Admission medications   Medication Sig Start Date End Date Taking? Authorizing Provider  Diphenhyd-Hydrocort-Nystatin (FIRST-DUKES MOUTHWASH) SUSP Swish and swallow 45ml (1tsp) by mouth four times daily. 05/28/13   Farrel Gobble, MD  fluconazole (DIFLUCAN) 200 MG tablet Take 1 tablet (200 mg total) by mouth daily. 08/13/13   Farrel Gobble, MD  lidocaine-prilocaine (EMLA) cream Apply a quarter size amount to port site 1 hour prior to chemo. Do not rub in. Cover with plastic wrap. 04/24/13  Farrel Gobble, MD  Liniments (BEN GAY EX) Apply 1 application topically daily as needed (for pain in hip and knee).    Historical Provider, MD  magnesium hydroxide (MILK OF MAGNESIA) 400 MG/5ML suspension Take 30 mLs by mouth daily as needed for mild constipation.    Historical Provider, MD  metoCLOPramide (REGLAN) 5 MG tablet Take 1 tablet (5 mg total) by mouth 4 (four) times daily as  needed for nausea. 04/24/13   Farrel Gobble, MD  ondansetron (ZOFRAN ODT) 8 MG disintegrating tablet Take 1 tablet (8 mg total) by mouth every 8 (eight) hours as needed for nausea or vomiting. 05/02/13   [facility], PA-C  oxyCODONE 10 MG TABS Take 1 tablet every 4-6 hours as needed for breakthrough pain. 04/26/13   Farrel Gobble, MD  OxyCODONE HCl ER 30 MG T12A Take 1 tablet every 12 hours. 04/26/13   Farrel Gobble, MD  potassium chloride SA (K-DUR,KLOR-CON) 20 MEQ tablet Take 1 tablet (20 mEq total) by mouth 2 (two) times daily. 06/11/13   Farrel Gobble, MD  predniSONE (DELTASONE) 20 MG tablet On Days 1-5 of chemo, take 4 tablets daily with a meal. 08/13/13   Farrel Gobble, MD  prochlorperazine (COMPAZINE) 10 MG tablet Take 1 tablet (10 mg total) by mouth 4 (four) times daily as needed for nausea or vomiting. 04/24/13   Farrel Gobble, MD  Sennosides (SENNA LAXATIVE PO) Take 100 mg by mouth. Takes 2 tabs at a times    Historical Provider, MD   There were no vitals taken for this visit. Physical Exam  Nursing note and vitals reviewed. Constitutional: He is oriented to person, place, and time. He appears well-developed and well-nourished.  Non-toxic appearance.  HENT:  Head: Normocephalic.  Right Ear: Tympanic membrane and external ear normal.  Left Ear: Tympanic membrane and external ear normal.  Eyes: EOM and lids are normal. Pupils are equal, round, and reactive to light.  Neck: Normal range of motion. Neck supple. Carotid bruit is not present.  Cardiovascular: Normal rate, regular rhythm, normal heart sounds, intact distal pulses and normal pulses.   Pulmonary/Chest: Breath sounds normal. No respiratory distress.  Abdominal: Soft. Bowel sounds are normal. There is no tenderness. There is no guarding.  Musculoskeletal: Normal range of motion.  There is no palpable deformity of the right hip. There is slight decreased range of motion, no evidence for dislocation. There are  no  hot areas noted of the hip. Good ROM of the right knee. Posterior tib pulse 2+. Difficulty palpating DP. Pt ambulates with a cane.  Lymphadenopathy:       Head (right side): No submandibular adenopathy present.       Head (left side): No submandibular adenopathy present.    He has no cervical adenopathy.  Neurological: He is alert and oriented to person, place, and time. He has normal strength. No cranial nerve deficit or sensory deficit.  Skin: Skin is warm and dry.  Psychiatric: He has a normal mood and affect. His speech is normal.    ED Course  Procedures (including critical care time) Labs Review Labs Reviewed - No data to display  Imaging Review No results found.   EKG Interpretation None      MDM X-ray of the right hip reveals no acute findings. 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 The plan at this time is for the patient to be treated with Percocet one every 6 hours for assistance with pain. Patient will see the primary physician next week.  Patient has been reassured of the negative x-rays.    Final diagnoses:  None    **I have reviewed nursing notes, vital signs, and all appropriate lab and imaging results for this patient.Lenox Ahr, PA-C 09/23/13 1544

## 2013-09-22 NOTE — Discharge Instructions (Signed)
Your x-ray is negative for fracture, dislocation, or bone abnormality. There are noted arthritis related changes. Please apply heat to your back and hip area. Please use Tylenol or ibuprofen for mild pain, use Percocet for more severe pain. Please see your physician on Monday as scheduled.

## 2013-09-23 NOTE — ED Provider Notes (Signed)
Medical screening examination/treatment/procedure(s) were conducted as a shared visit with non-physician practitioner(s) and myself.  I personally evaluated the patient during the encounter.  R sided pain in area of sciatica notch.  No fever, weakness, numbness, tingling, bowel or bladder incontinence.  Hx B cell lymphoma in R groin.  5/5 strength in bilateral lower extremities. Ankle plantar and dorsiflexion intact. Great toe extension intact bilaterally. +2 DP and PT pulses. +2 patellar reflexes bilaterally. Normal gait.    EKG Interpretation None       Ezequiel Essex, MD 09/23/13 2000

## 2013-09-24 ENCOUNTER — Encounter (HOSPITAL_BASED_OUTPATIENT_CLINIC_OR_DEPARTMENT_OTHER): Payer: Medicare Other

## 2013-09-24 ENCOUNTER — Encounter (HOSPITAL_COMMUNITY): Payer: Self-pay

## 2013-09-24 VITALS — BP 105/74 | HR 78 | Temp 97.2°F | Resp 16 | Wt 154.4 lb

## 2013-09-24 DIAGNOSIS — C8589 Other specified types of non-Hodgkin lymphoma, extranodal and solid organ sites: Secondary | ICD-10-CM

## 2013-09-24 DIAGNOSIS — C8330 Diffuse large B-cell lymphoma, unspecified site: Secondary | ICD-10-CM

## 2013-09-24 DIAGNOSIS — Z5111 Encounter for antineoplastic chemotherapy: Secondary | ICD-10-CM

## 2013-09-24 DIAGNOSIS — G5701 Lesion of sciatic nerve, right lower limb: Secondary | ICD-10-CM

## 2013-09-24 DIAGNOSIS — Z5112 Encounter for antineoplastic immunotherapy: Secondary | ICD-10-CM

## 2013-09-24 DIAGNOSIS — C8580 Other specified types of non-Hodgkin lymphoma, unspecified site: Secondary | ICD-10-CM

## 2013-09-24 DIAGNOSIS — G5700 Lesion of sciatic nerve, unspecified lower limb: Secondary | ICD-10-CM

## 2013-09-24 LAB — CBC WITH DIFFERENTIAL/PLATELET
BASOS ABS: 0 10*3/uL (ref 0.0–0.1)
BASOS PCT: 0 % (ref 0–1)
EOS ABS: 0.1 10*3/uL (ref 0.0–0.7)
Eosinophils Relative: 1 % (ref 0–5)
HCT: 29.3 % — ABNORMAL LOW (ref 39.0–52.0)
Hemoglobin: 9.5 g/dL — ABNORMAL LOW (ref 13.0–17.0)
Lymphocytes Relative: 13 % (ref 12–46)
Lymphs Abs: 1.1 10*3/uL (ref 0.7–4.0)
MCH: 30.6 pg (ref 26.0–34.0)
MCHC: 32.4 g/dL (ref 30.0–36.0)
MCV: 94.5 fL (ref 78.0–100.0)
MONOS PCT: 13 % — AB (ref 3–12)
Monocytes Absolute: 1.1 10*3/uL — ABNORMAL HIGH (ref 0.1–1.0)
NEUTROS ABS: 6 10*3/uL (ref 1.7–7.7)
Neutrophils Relative %: 73 % (ref 43–77)
PLATELETS: 216 10*3/uL (ref 150–400)
RBC: 3.1 MIL/uL — ABNORMAL LOW (ref 4.22–5.81)
RDW: 19.7 % — AB (ref 11.5–15.5)
WBC: 8.3 10*3/uL (ref 4.0–10.5)

## 2013-09-24 LAB — BASIC METABOLIC PANEL
Anion gap: 10 (ref 5–15)
BUN: 15 mg/dL (ref 6–23)
CALCIUM: 9.4 mg/dL (ref 8.4–10.5)
CO2: 28 mEq/L (ref 19–32)
CREATININE: 0.78 mg/dL (ref 0.50–1.35)
Chloride: 97 mEq/L (ref 96–112)
GFR calc Af Amer: >90 mL/min (ref >90)
Glucose, Bld: 114 mg/dL — ABNORMAL HIGH (ref 70–99)
Potassium: 4 mEq/L (ref 3.7–5.3)
Sodium: 135 mEq/L — ABNORMAL LOW (ref 137–147)

## 2013-09-24 MED ORDER — SODIUM CHLORIDE 0.9 % IV SOLN
10.0000 mg/m2 | Freq: Once | INTRAVENOUS | Status: AC
  Administered 2013-09-24: 20 mg via INTRAVENOUS
  Filled 2013-09-24: qty 10

## 2013-09-24 MED ORDER — HEPARIN SOD (PORK) LOCK FLUSH 100 UNIT/ML IV SOLN
500.0000 [IU] | Freq: Once | INTRAVENOUS | Status: AC | PRN
  Administered 2013-09-24: 500 [IU]
  Filled 2013-09-24: qty 5

## 2013-09-24 MED ORDER — VINCRISTINE SULFATE CHEMO INJECTION 1 MG/ML
1.0000 mg | Freq: Once | INTRAVENOUS | Status: AC
  Administered 2013-09-24: 1 mg via INTRAVENOUS
  Filled 2013-09-24: qty 1

## 2013-09-24 MED ORDER — SODIUM CHLORIDE 0.9 % IV SOLN
Freq: Once | INTRAVENOUS | Status: AC
  Administered 2013-09-24: 16 mg via INTRAVENOUS
  Filled 2013-09-24: qty 8

## 2013-09-24 MED ORDER — SODIUM CHLORIDE 0.9 % IV SOLN
375.0000 mg/m2 | Freq: Once | INTRAVENOUS | Status: AC
  Administered 2013-09-24: 700 mg via INTRAVENOUS
  Filled 2013-09-24: qty 70

## 2013-09-24 MED ORDER — LACTATED RINGERS IV SOLN
500.0000 mg/m2 | Freq: Once | INTRAVENOUS | Status: AC
  Administered 2013-09-24: 910 mg via INTRAVENOUS
  Filled 2013-09-24: qty 91

## 2013-09-24 MED ORDER — DIPHENHYDRAMINE HCL 25 MG PO CAPS: 50.0000 mg | ORAL_CAPSULE | Freq: Once | ORAL | Status: DC

## 2013-09-24 MED ORDER — ACETAMINOPHEN 325 MG PO TABS: 650.0000 mg | ORAL_TABLET | Freq: Once | ORAL | Status: DC

## 2013-09-24 MED ORDER — HEPARIN SOD (PORK) LOCK FLUSH 100 UNIT/ML IV SOLN
INTRAVENOUS | Status: AC
  Filled 2013-09-24: qty 5

## 2013-09-24 MED ORDER — OXYCODONE HCL 5 MG PO TABS: ORAL_TABLET | ORAL | Status: DC

## 2013-09-24 MED ORDER — SODIUM CHLORIDE 0.9 % IV SOLN
750.0000 mg/m2 | Freq: Once | INTRAVENOUS | Status: AC
  Administered 2013-09-24: 1420 mg via INTRAVENOUS
  Filled 2013-09-24: qty 71

## 2013-09-24 MED ORDER — SODIUM CHLORIDE 0.9 % IV SOLN
Freq: Once | INTRAVENOUS | Status: AC
  Administered 2013-09-24: 09:00:00 via INTRAVENOUS

## 2013-09-24 MED ORDER — SODIUM CHLORIDE 0.9 % IJ SOLN: 10.0000 mL | INTRAMUSCULAR | Status: DC | PRN

## 2013-09-24 NOTE — Progress Notes (Signed)
Tolerated well

## 2013-09-24 NOTE — Progress Notes (Signed)
The patients family called for assistance the pt was found sitting in the floor at foot of chair, stated "I just got my feet tangled", assisted back to bed by staff member.   Pt alert throughout and stated he should not have been up without help.  Denies any injuries and able to move all extremities without difficulty.

## 2013-09-24 NOTE — Patient Instructions (Signed)
[facility] Discharge Instructions for Patients Receiving Chemotherapy  Today you received the last scheduled cycle of chemotherapy. Return to clinic tomorrow as scheduled for Neulasta injection. PET scan as scheduled 10/12/13. MD appointment 10/15/13. Port flush every 6 weeks unless we are actively using it more often. To help prevent nausea and vomiting after your treatment, we encourage you to take your nausea medication as instructed. If you develop nausea and vomiting that is not controlled by your nausea medication, call the clinic. If it is after clinic hours your family physician or the after hours number for the clinic or go to the Emergency Department.   BELOW ARE SYMPTOMS THAT SHOULD BE REPORTED IMMEDIATELY:  *FEVER GREATER THAN 101.0 F  *CHILLS WITH OR WITHOUT FEVER  NAUSEA AND VOMITING THAT IS NOT CONTROLLED WITH YOUR NAUSEA MEDICATION  *UNUSUAL SHORTNESS OF BREATH  *UNUSUAL BRUISING OR BLEEDING  TENDERNESS IN MOUTH AND THROAT WITH OR WITHOUT PRESENCE OF ULCERS  *URINARY PROBLEMS  *BOWEL PROBLEMS  UNUSUAL RASH Items with * indicate a potential emergency and should be followed up as soon as possible.   I have been informed and understand all the instructions given to me. I know to contact the clinic, my physician, or go to the Emergency Department if any problems should occur. I do not have any questions at this time, but understand that I may call the clinic during office hours or the Patient Navigator at [phone] should I have any questions or need assistance in obtaining follow up care.    __________________________________________  _____________  __________ Signature of Patient or Authorized Representative            Date                   Time    __________________________________________ Nurse's Signature

## 2013-09-24 NOTE — Progress Notes (Signed)
09/24/2013 1230 Report received from T. Louie Bun, RN.  Pt asleep at the moment but easily arousable to verbal; denies any complaints.  Assisted patient to restroom shortly after waking.  Chemotherapies infusing.  Fall precautions remain in place.

## 2013-09-24 NOTE — Progress Notes (Signed)
Devin Zuniga  OFFICE PROGRESS NOTE  Devin Bellow, MD [address] [place] [address]  DIAGNOSIS: Diffuse large B cell lymphoma - Plan: NM PET Image Restag (PS) Skull Base To Thigh  Piriformis syndrome of right side  Chief Complaint  Patient presents with  . Diffuse large B-cell lymphoma  . Doxorubicin induced cardiomyopathy    CURRENT THERAPY: R.-CHOP every 3 weeks x6, R.-CNOP x1 with plans to repeat the latter today to complete chemotherapy. Treatment was changed to 2 drop in ejection fraction and Zinecard was added to cycle #7 and will be added again today to prevent further cardiac damage.  INTERVAL HISTORY: Devin Zuniga 68 y.o. 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 Plan is to complete treatment with 8 cycles total which will be given today using Novantrone after which repeat PET/CT scan will be done. Three days ago he develops severe right hip pain and right leg pain to the point where he could not sleep. He was seen in the emergency room on 09/22/2013 at which time plain x-rays were done which failed to reveal any evidence of fracture. He was given prescription for Percocet. Pain is better now with some discomfort involving the left thigh as well. He denies any urinary incontinence, constipation, diarrhea, melena, hematochezia, hematuria, PND, orthopnea, palpitations, sore throat, skin rash, headache, or seizures.    MEDICAL HISTORY: Past Medical History  Diagnosis Date  . Cancer   . Diffuse large B cell lymphoma 04/24/2013    INTERIM HISTORY: has Piriformis syndrome of right side; Right hip pain; Back pain; Lymphadenopathy, generalized;  Diffuse large B cell lymphoma; and Hypokalemia on his problem list.    ALLERGIES:  has No Known Allergies.  MEDICATIONS: has a current medication list which includes the following prescription(s): allopurinol, first-dukes mouthwash, esomeprazole, fluconazole, lidocaine-prilocaine, liniments, magnesium hydroxide, metoclopramide, ondansetron, oxycodone, oxycodone hcl, oxycodone hcl er, oxycodone-acetaminophen, oxycodone-acetaminophen, potassium chloride sa, prednisone, prochlorperazine, sennosides, and zinc gluconate, and the following Facility-Administered Medications: acetaminophen, cyclophosphamide (CYTOXAN) 1,420 mg in sodium chloride 0.9 % 250 mL chemo infusion, dexrazoxane (ZINECARD) 910 mg in lactated ringers 250 mL IVPB, diphenhydramine, heparin lock flush, mitoXANtrone (NOVANTRONE) 20 mg in sodium chloride 0.9 % 50 mL chemo infusion, sodium chloride, sodium chloride, and vinCRIStine (ONCOVIN) 1 mg in sodium chloride 0.9 % 50 mL chemo infusion.  SURGICAL HISTORY:  Past Surgical History  Procedure Laterality Date  . Portacath placement Right 04/27/13    FAMILY HISTORY: family history includes Cancer in his brother.  SOCIAL HISTORY:  reports that he has been smoking Cigarettes.  He has been smoking about 0.25 packs per day. He has never used smokeless tobacco. He reports that he does not drink alcohol or use illicit drugs.  REVIEW OF SYSTEMS:  Other than that discussed above is noncontributory.  PHYSICAL EXAMINATION: ECOG PERFORMANCE STATUS: 1 - Symptomatic but completely ambulatory  There were no vitals taken for this visit.  GENERAL:alert, no distress and comfortable. Alopecia the scalp. SKIN: skin color, texture, turgor are normal, no rashes or significant lesions EYES: PERLA; Conjunctiva are pink and non-injected, sclera clear SINUSES: No redness or tenderness over maxillary or ethmoid sinuses OROPHARYNX:no exudate, no erythema on lips, buccal mucosa, or  tongue. NECK: supple,  thyroid normal size, non-tender, without nodularity. No masses CHEST: Increased AP diameter with light port in place. LYMPH:  no palpable lymphadenopathy in the cervical, axillary or inguinal LUNGS: clear to auscultation and percussion with normal breathing effort HEART: regular rate & rhythm and no murmurs. ABDOMEN:abdomen soft, non-tender and normal bowel sounds. Liver and spleen not enlarged. No free fluid or shifting dullness. MUSCULOSKELETAL:no cyanosis of digits and no clubbing. Range of motion normal.  NEURO: alert & oriented x 3 with fluent speech, no focal motor/sensory deficits. Negative straight leg raising bilaterally. Minimal mid thigh tenderness.   LABORATORY DATA: Infusion on 09/24/2013  Component Date Value Ref Range Status  . Sodium 09/24/2013 135* 137 - 147 mEq/L Final  . Potassium 09/24/2013 4.0  3.7 - 5.3 mEq/L Final  . Chloride 09/24/2013 97  96 - 112 mEq/L Final  . CO2 09/24/2013 28  19 - 32 mEq/L Final  . Glucose, Bld 09/24/2013 114* 70 - 99 mg/dL Final  . BUN 09/24/2013 15  6 - 23 mg/dL Final  . Creatinine, Ser 09/24/2013 0.78  0.50 - 1.35 mg/dL Final  . Calcium 09/24/2013 9.4  8.4 - 10.5 mg/dL Final  . GFR calc non Af Amer 09/24/2013 >90  >90 mL/min Final  . GFR calc Af Amer 09/24/2013 >90  >90 mL/min Final   Comment: (NOTE)                          The eGFR has been calculated using the CKD EPI equation.                          This calculation has not been validated in all clinical situations.                          eGFR's persistently <90 mL/min signify possible Chronic Kidney                          Disease.  . Anion gap 09/24/2013 10  5 - 15 Final  . WBC 09/24/2013 8.3  4.0 - 10.5 K/uL Final  . RBC 09/24/2013 3.10* 4.22 - 5.81 MIL/uL Final  . Hemoglobin 09/24/2013 9.5* 13.0 - 17.0 g/dL Final  . HCT 09/24/2013 29.3* 39.0 - 52.0 % Final  . MCV 09/24/2013 94.5  78.0 - 100.0 fL Final  . MCH 09/24/2013 30.6  26.0 - 34.0 pg Final  . MCHC 09/24/2013 32.4   30.0 - 36.0 g/dL Final  . RDW 09/24/2013 19.7* 11.5 - 15.5 % Final  . Platelets 09/24/2013 216  150 - 400 K/uL Final  . Neutrophils Relative % 09/24/2013 73  43 - 77 % Final  . Neutro Abs 09/24/2013 6.0  1.7 - 7.7 K/uL Final  . Lymphocytes Relative 09/24/2013 13  12 - 46 % Final  . Lymphs Abs 09/24/2013 1.1  0.7 - 4.0 K/uL Final  . Monocytes Relative 09/24/2013 13* 3 - 12 % Final  . Monocytes Absolute 09/24/2013 1.1* 0.1 - 1.0 K/uL Final  . Eosinophils Relative 09/24/2013 1  0 - 5 % Final  . Eosinophils Absolute 09/24/2013 0.1  0.0 - 0.7 K/uL Final  . Basophils Relative 09/24/2013 0  0 - 1 % Final  . Basophils Absolute 09/24/2013 0.0  0.0 - 0.1 K/uL Final  Infusion on 09/03/2013  Component Date Value Ref Range Status  .  WBC 09/03/2013 8.6  4.0 - 10.5 K/uL Final  . RBC 09/03/2013 3.54* 4.22 - 5.81 MIL/uL Final  . Hemoglobin 09/03/2013 10.8* 13.0 - 17.0 g/dL Final  . HCT 09/03/2013 33.8* 39.0 - 52.0 % Final  . MCV 09/03/2013 95.5  78.0 - 100.0 fL Final  . MCH 09/03/2013 30.5  26.0 - 34.0 pg Final  . MCHC 09/03/2013 32.0  30.0 - 36.0 g/dL Final  . RDW 09/03/2013 19.6* 11.5 - 15.5 % Final  . Platelets 09/03/2013 255  150 - 400 K/uL Final  . Neutrophils Relative % 09/03/2013 58  43 - 77 % Final  . Neutro Abs 09/03/2013 5.0  1.7 - 7.7 K/uL Final  . Lymphocytes Relative 09/03/2013 30  12 - 46 % Final  . Lymphs Abs 09/03/2013 2.6  0.7 - 4.0 K/uL Final  . Monocytes Relative 09/03/2013 11  3 - 12 % Final  . Monocytes Absolute 09/03/2013 0.9  0.1 - 1.0 K/uL Final  . Eosinophils Relative 09/03/2013 0  0 - 5 % Final  . Eosinophils Absolute 09/03/2013 0.0  0.0 - 0.7 K/uL Final  . Basophils Relative 09/03/2013 1  0 - 1 % Final  . Basophils Absolute 09/03/2013 0.1  0.0 - 0.1 K/uL Final  . Sodium 09/03/2013 141  137 - 147 mEq/L Final  . Potassium 09/03/2013 4.3  3.7 - 5.3 mEq/L Final  . Chloride 09/03/2013 104  96 - 112 mEq/L Final  . CO2 09/03/2013 26  19 - 32 mEq/L Final  . Glucose, Bld  09/03/2013 110* 70 - 99 mg/dL Final  . BUN 09/03/2013 12  6 - 23 mg/dL Final  . Creatinine, Ser 09/03/2013 0.76  0.50 - 1.35 mg/dL Final  . Calcium 09/03/2013 9.2  8.4 - 10.5 mg/dL Final  . Total Protein 09/03/2013 6.4  6.0 - 8.3 g/dL Final  . Albumin 09/03/2013 3.2* 3.5 - 5.2 g/dL Final  . AST 09/03/2013 19  0 - 37 U/L Final  . ALT 09/03/2013 10  0 - 53 U/L Final  . Alkaline Phosphatase 09/03/2013 101  39 - 117 U/L Final  . Total Bilirubin 09/03/2013 0.2* 0.3 - 1.2 mg/dL Final  . GFR calc non Af Amer 09/03/2013 >90  >90 mL/min Final  . GFR calc Af Amer 09/03/2013 >90  >90 mL/min Final   Comment: (NOTE)                          The eGFR has been calculated using the CKD EPI equation.                          This calculation has not been validated in all clinical situations.                          eGFR's persistently <90 mL/min signify possible Chronic Kidney                          Disease.  . Anion gap 09/03/2013 11  5 - 15 Final  . Uric Acid, Serum 09/03/2013 2.6* 4.0 - 7.8 mg/dL Final    PATHOLOGY: No new pathology.  Urinalysis No results found for this basename: colorurine,  appearanceur,  labspec,  phurine,  glucoseu,  hgbur,  bilirubinur,  ketonesur,  proteinur,  urobilinogen,  nitrite,  leukocytesur    RADIOGRAPHIC STUDIES: Dg Hip Complete Right  09/22/2013   CLINICAL DATA:  Right groin pain and hip pain.  EXAM: RIGHT HIP - COMPLETE 2+ VIEW  COMPARISON:  PET-CT 07/06/2013  FINDINGS: There is no evidence of hip fracture or dislocation. Mild degenerative changes involve the right hip.  IMPRESSION: 1. No acute findings. 2. Osteoarthritis.   Electronically Signed   By: Kerby Moors M.D.   On: 09/22/2013 18:43   Nm Cardiac Muga Rest  08/27/2013   CLINICAL DATA:  On chemotherapy.  EXAM: NUCLEAR MEDICINE CARDIAC BLOOD POOL IMAGING (MUGA)  TECHNIQUE: Cardiac multi-gated acquisition was performed at rest following intravenous injection of Tc-33mlabeled red blood cells.   RADIOPHARMACEUTICALS:  27.0 [id number]-vitro labeled red blood cells.  COMPARISON:  None.  FINDINGS: The left ventricular ejection fraction is equal to 54.5%. On the previous examination the ejection fraction was equal to 64%. Normal left ventricular wall motion.  IMPRESSION: The left ventricular ejection fraction is equal to 55%. This is decreased from 64% previously. Findings represent a 14% interval decline in ejection fraction.   Electronically Signed   By: TaKerby Moors.D.   On: 08/27/2013 16:17    ASSESSMENT:  #1. Stage IV diffuse large B-cell lymphoma with right femur involvement manifesting as generalized lymphadenopathy and right lower extremity swelling with pain, excellent improvement with no analgesic requirement whatsoever but still with residual right femur discomfort without swelling.. Marland KitchenET scan is still mildly positive with new areas of involvement which I believe are related to his sinus problems, for additional chemotherapy day substituting mitoxantrone for doxorubicin for this cycle as well as cycle #8 due to drop in ejection fraction. Will use prophylactic therapy with Zinecard for last 2 cycles, last of which is to be given today.  #2. Chronic obstructive pulmonary disease.  #3. Gastroesophageal reflux disease, controlled.  #4. Symptomatic allergic rhinitis.  #5. Persistent oral candidiasis, controlled with continuous Diflucan. #6. Right piriformis syndrome.    PLAN:  #1. Oxycodone for pain. #2. R-CNOP representing last dose of chemotherapy followed by Neulasta tomorrow. #3. Repeat PET/CT scan in approximately on 10/12/2013  with visit on 10/15/2013.   All questions were answered. The patient knows to call the clinic with any problems, questions or concerns. We can certainly see the patient much sooner if necessary.   I spent 25 minutes counseling the patient face to face. The total time spent in the appointment was 30 minutes.    FoDoroteo BradfordMD 09/24/2013  10:26 AM  DISCLAIMER:  This note was dictated with voice recognition software.  Similar sounding words can inadvertently be transcribed inaccurately and may not be corrected upon review.

## 2013-09-25 ENCOUNTER — Encounter (HOSPITAL_BASED_OUTPATIENT_CLINIC_OR_DEPARTMENT_OTHER): Payer: Medicare Other

## 2013-09-25 VITALS — BP 132/79 | HR 113 | Temp 98.2°F | Resp 20

## 2013-09-25 DIAGNOSIS — C8589 Other specified types of non-Hodgkin lymphoma, extranodal and solid organ sites: Secondary | ICD-10-CM

## 2013-09-25 DIAGNOSIS — C8330 Diffuse large B-cell lymphoma, unspecified site: Secondary | ICD-10-CM

## 2013-09-25 DIAGNOSIS — Z5189 Encounter for other specified aftercare: Secondary | ICD-10-CM

## 2013-09-25 MED ORDER — PEGFILGRASTIM INJECTION 6 MG/0.6ML: 6.0000 mg | Freq: Once | SUBCUTANEOUS | Status: DC

## 2013-09-25 MED ORDER — PEGFILGRASTIM INJECTION 6 MG/0.6ML
SUBCUTANEOUS | Status: AC
  Filled 2013-09-25: qty 0.6

## 2013-09-25 MED ORDER — PEGFILGRASTIM INJECTION 6 MG/0.6ML
6.0000 mg | Freq: Once | SUBCUTANEOUS | Status: AC
  Administered 2013-09-25: 6 mg via SUBCUTANEOUS

## 2013-09-25 MED FILL — Oxycodone w/ Acetaminophen Tab 5-325 MG: ORAL | Qty: 6 | Status: AC

## 2013-09-25 NOTE — Progress Notes (Signed)
Devin Zuniga presents today for injection per MD orders. Neulasta 6mg  administered SQ in left Abdomen. Administration without incident. Patient tolerated well.

## 2013-10-12 ENCOUNTER — Other Ambulatory Visit (HOSPITAL_COMMUNITY): Payer: Self-pay

## 2013-10-12 ENCOUNTER — Encounter (HOSPITAL_COMMUNITY)
Admission: RE | Admit: 2013-10-12 | Discharge: 2013-10-12 | Disposition: A | Discharge status: Home / Self Care | Payer: Medicare Other | Source: Ambulatory Visit | Attending: Diagnostic Radiology | Admitting: Diagnostic Radiology

## 2013-10-12 ENCOUNTER — Other Ambulatory Visit (HOSPITAL_COMMUNITY): Payer: Self-pay | Admitting: Hematology and Oncology

## 2013-10-12 ENCOUNTER — Encounter (HOSPITAL_COMMUNITY): Payer: Self-pay

## 2013-10-12 DIAGNOSIS — D492 Neoplasm of unspecified behavior of bone, soft tissue, and skin: Secondary | ICD-10-CM | POA: Diagnosis not present

## 2013-10-12 DIAGNOSIS — C8588 Other specified types of non-Hodgkin lymphoma, lymph nodes of multiple sites: Secondary | ICD-10-CM | POA: Diagnosis not present

## 2013-10-12 DIAGNOSIS — C8330 Diffuse large B-cell lymphoma, unspecified site: Secondary | ICD-10-CM

## 2013-10-12 DIAGNOSIS — J984 Other disorders of lung: Secondary | ICD-10-CM | POA: Insufficient documentation

## 2013-10-12 DIAGNOSIS — C8589 Other specified types of non-Hodgkin lymphoma, extranodal and solid organ sites: Secondary | ICD-10-CM | POA: Diagnosis present

## 2013-10-12 LAB — GLUCOSE, CAPILLARY: GLUCOSE-CAPILLARY: 85 mg/dL (ref 70–99)

## 2013-10-12 MED ORDER — FLUDEOXYGLUCOSE F - 18 (FDG) INJECTION: 7.6600 | Freq: Once | INTRAVENOUS | Status: AC | PRN

## 2013-10-15 ENCOUNTER — Encounter (HOSPITAL_COMMUNITY): Payer: Self-pay

## 2013-10-15 ENCOUNTER — Encounter (HOSPITAL_BASED_OUTPATIENT_CLINIC_OR_DEPARTMENT_OTHER): Payer: Medicare Other

## 2013-10-15 ENCOUNTER — Encounter (HOSPITAL_COMMUNITY): Payer: Medicare Other | Attending: Hematology and Oncology

## 2013-10-15 VITALS — BP 104/69 | HR 108 | Temp 98.5°F | Resp 16 | Wt 149.2 lb

## 2013-10-15 DIAGNOSIS — T451X5A Adverse effect of antineoplastic and immunosuppressive drugs, initial encounter: Secondary | ICD-10-CM

## 2013-10-15 DIAGNOSIS — C8589 Other specified types of non-Hodgkin lymphoma, extranodal and solid organ sites: Secondary | ICD-10-CM | POA: Insufficient documentation

## 2013-10-15 DIAGNOSIS — G620 Drug-induced polyneuropathy: Secondary | ICD-10-CM

## 2013-10-15 DIAGNOSIS — C8330 Diffuse large B-cell lymphoma, unspecified site: Secondary | ICD-10-CM

## 2013-10-15 DIAGNOSIS — G622 Polyneuropathy due to other toxic agents: Secondary | ICD-10-CM

## 2013-10-15 LAB — CBC WITH DIFFERENTIAL/PLATELET
BASOS PCT: 1 % (ref 0–1)
Basophils Absolute: 0 10*3/uL (ref 0.0–0.1)
Eosinophils Absolute: 0 10*3/uL (ref 0.0–0.7)
Eosinophils Relative: 1 % (ref 0–5)
HCT: 29.6 % — ABNORMAL LOW (ref 39.0–52.0)
Hemoglobin: 9.5 g/dL — ABNORMAL LOW (ref 13.0–17.0)
LYMPHS ABS: 0.5 10*3/uL — AB (ref 0.7–4.0)
Lymphocytes Relative: 9 % — ABNORMAL LOW (ref 12–46)
MCH: 30.2 pg (ref 26.0–34.0)
MCHC: 32.1 g/dL (ref 30.0–36.0)
MCV: 94 fL (ref 78.0–100.0)
MONOS PCT: 12 % (ref 3–12)
Monocytes Absolute: 0.7 10*3/uL (ref 0.1–1.0)
NEUTROS ABS: 4.4 10*3/uL (ref 1.7–7.7)
NEUTROS PCT: 77 % (ref 43–77)
PLATELETS: 167 10*3/uL (ref 150–400)
RBC: 3.15 MIL/uL — AB (ref 4.22–5.81)
RDW: 20.3 % — ABNORMAL HIGH (ref 11.5–15.5)
WBC: 5.6 10*3/uL (ref 4.0–10.5)

## 2013-10-15 LAB — COMPREHENSIVE METABOLIC PANEL
ALK PHOS: 103 U/L (ref 39–117)
ALT: 9 U/L (ref 0–53)
ANION GAP: 13 (ref 5–15)
AST: 24 U/L (ref 0–37)
Albumin: 3.2 g/dL — ABNORMAL LOW (ref 3.5–5.2)
BILIRUBIN TOTAL: 0.3 mg/dL (ref 0.3–1.2)
BUN: 9 mg/dL (ref 6–23)
CHLORIDE: 99 meq/L (ref 96–112)
CO2: 26 mEq/L (ref 19–32)
Calcium: 9.1 mg/dL (ref 8.4–10.5)
Creatinine, Ser: 0.76 mg/dL (ref 0.50–1.35)
GFR calc Af Amer: >90 mL/min (ref >90)
GFR calc non Af Amer: >90 mL/min (ref >90)
Glucose, Bld: 106 mg/dL — ABNORMAL HIGH (ref 70–99)
Potassium: 3.7 mEq/L (ref 3.7–5.3)
SODIUM: 138 meq/L (ref 137–147)
Total Protein: 6.3 g/dL (ref 6.0–8.3)

## 2013-10-15 LAB — RETICULOCYTES
RBC.: 3.15 MIL/uL — AB (ref 4.22–5.81)
RETIC COUNT ABSOLUTE: 47.3 10*3/uL (ref 19.0–186.0)
RETIC CT PCT: 1.5 % (ref 0.4–3.1)

## 2013-10-15 LAB — LACTATE DEHYDROGENASE: LDH: 424 U/L — ABNORMAL HIGH (ref 94–250)

## 2013-10-15 MED ORDER — OXYCODONE HCL 5 MG PO TABS
ORAL_TABLET | ORAL | Status: AC
Start: 2013-10-15 — End: ?

## 2013-10-15 MED ORDER — FENTANYL 50 MCG/HR TD PT72: 50.0000 ug | MEDICATED_PATCH | TRANSDERMAL | Status: AC

## 2013-10-15 MED ORDER — LACOSAMIDE 100 MG PO TABS: ORAL_TABLET | ORAL | Status: AC

## 2013-10-15 NOTE — Addendum Note (Signed)
Addended by: Mellissa Kohut on: 10/15/2013 06:17 PM   Modules accepted: Orders

## 2013-10-15 NOTE — Progress Notes (Signed)
LABS FOR CBCD,CMP,LDH,B2M,RETIC,

## 2013-10-15 NOTE — Progress Notes (Signed)
STAR Program Physical Impairment and Functional Assessment Screening Tool  1. Are you having any pain, including headaches, joint pain, or muscle pain (upper body = OT; lower body = PT)?  Yes, this started after my diagnosis and is still a problem.  2. Do your hands and/or feet feel numb or tingle (PT)?  Yes, this started after my diagnosis and is still a problem. - knees especially.  3. Does any part of your body feel swollen or larger than usual (upper body = OT; lower body = PT)?  No  4. Are you so tired that you cannot do the things you want or need to do (PT or OT)?  Yes, this started after my diagnosis and is still a problem.  5. Are you feeling weak or are you having trouble moving any part of your body (PT/OT)?  Yes, this started after my diagnosis and is still a problem.  6. Are you having trouble concentrating, thinking, or remembering things (OT/ST)?  No  7. Are you having trouble moving around or feel like you might trip or fall (PT)?  Yes, this started after my diagnosis and is still a problem.  8. Are you having trouble swallowing (ST)?  No  9. Are you having trouble speaking (ST)?  No  10. Are you having trouble with going or getting to the bathroom (OT)?  No  11. Are you having trouble with your sexual function (OT)?  No  12. Are you having trouble lifting things, even just your arms (OT/PT)?  No  13. Are you having trouble taking care of yourself as in dressing or bathing (OT)?  Yes, this started after my diagnosis and is still a problem. (mostly with bathing  14. Are you having trouble with daily tasks like chores or shopping (OT)?  Yes, this started after my diagnosis and is still a problem.  15. Are you having trouble driving (OT)?  Yes, this started after my diagnosis and is still a problem.  69. Are you having trouble returning to work or completing your tasks at work (OT)?  not  working  Other concerns:    Legend: OT =  Occupational Therapy PT = Physical Therapy ST = Speech Therapy

## 2013-10-15 NOTE — Progress Notes (Signed)
[facility]  OFFICE PROGRESS NOTE  [name], MD [address] [place] [address]  DIAGNOSIS: Diffuse large B cell lymphoma  Peripheral neuropathy due to chemotherapy  Chief Complaint  Patient presents with  . Diffuse large B cell lymphoma    CURRENT THERAPY: R.-CHOP every 3 weeks x6, R.-CNOP x2 with last treatment given on 09/24/2013 Treatment was changed to R-CNOP do to decrease in ejection fraction with Zinecard  added to cycle #7 and #8.      INTERVAL HISTORY: Devin Zuniga 68 y.o. 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  Since his last visit he has had increasing pain in both lower extremities with burning sensation not responsive to 10 mg of oxycodone every 2-3 hours. He denies any nausea, vomiting, sore mouth, diarrhea, constipation, melena, hematochezia, hematuria, fever, night sweats, cough or shortness of breath. Appetite is poor. He does admit to easy satiety.  MEDICAL HISTORY: Past Medical History  Diagnosis Date  . Cancer   . Diffuse large B cell lymphoma 04/24/2013    INTERIM HISTORY: has Piriformis syndrome of right side; Right hip pain; Back pain; Lymphadenopathy, generalized; Diffuse large B cell lymphoma; and Hypokalemia on his problem list.    ALLERGIES:  has No Known Allergies.  MEDICATIONS: has a current medication list which includes the following prescription(s): first-dukes mouthwash, lidocaine-prilocaine, liniments,  magnesium hydroxide, metoclopramide, ondansetron, oxycodone, potassium chloride sa, prednisone, zinc gluconate, esomeprazole, fentanyl, lacosamide, oxycodone hcl er, oxycodone-acetaminophen, prochlorperazine, and sennosides, and the following Facility-Administered Medications: sodium chloride.  SURGICAL HISTORY:  Past Surgical History  Procedure Laterality Date  . Portacath placement Right 04/27/13    FAMILY HISTORY: family history includes Cancer in his brother.  SOCIAL HISTORY:  reports that he has been smoking Cigarettes.  He has been smoking about 0.25 packs per day. He has never used smokeless tobacco. He reports that he does not drink alcohol or use illicit drugs.  REVIEW OF SYSTEMS:  Other than that discussed above is noncontributory.  PHYSICAL EXAMINATION: ECOG PERFORMANCE STATUS: 2 - Symptomatic, <50% confined to bed  Blood pressure 104/69, pulse 108, temperature 98.5 F (36.9 C), temperature source Oral, resp. rate 16, weight 149 lb 3.2 oz (67.677 kg), SpO2 98.00%.  GENERAL:alert, and moderately severe distress due to lower extremity paresthesias. SKIN: skin color, texture, turgor are normal, no rashes or significant lesions EYES: PERLA; Conjunctiva are pink and non-injected, sclera clear SINUSES: No redness or tenderness over maxillary or ethmoid sinuses OROPHARYNX:no exudate, no erythema on lips, buccal mucosa, or tongue. NECK: supple, thyroid normal size, non-tender, without nodularity. No masses CHEST: Increased AP diameter with life port in place. LYMPH:  no palpable lymphadenopathy in the cervical, axillary or inguinal LUNGS: clear to auscultation and percussion with normal breathing effort HEART: regular rate & rhythm and no murmurs. ABDOMEN:abdomen soft, non-tender and normal bowel sounds. No distention. MUSCULOSKELETAL:no cyanosis of digits and no clubbing. Range of motion normal. Generalized lower extremity tenderness. NEURO: alert & oriented x 3  with fluent speech,  no focal motor/sensory deficits   LABORATORY DATA: Lab on 10/15/2013  Component Date Value Ref Range Status  . WBC 10/15/2013 5.6  4.0 - 10.5 K/uL Final  . RBC 10/15/2013 3.15* 4.22 - 5.81 MIL/uL Final  . Hemoglobin 10/15/2013 9.5* 13.0 - 17.0 g/dL Final  . HCT 10/15/2013 29.6* 39.0 - 52.0 % Final  . MCV 10/15/2013 94.0  78.0 - 100.0 fL Final  . MCH 10/15/2013 30.2  26.0 - 34.0 pg Final  . MCHC 10/15/2013 32.1  30.0 - 36.0 g/dL Final  . RDW 10/15/2013 20.3* 11.5 - 15.5 % Final  . Platelets 10/15/2013 167  150 - 400 K/uL Final  . Neutrophils Relative % 10/15/2013 77  43 - 77 % Final  . Neutro Abs 10/15/2013 4.4  1.7 - 7.7 K/uL Final  . Lymphocytes Relative 10/15/2013 9* 12 - 46 % Final  . Lymphs Abs 10/15/2013 0.5* 0.7 - 4.0 K/uL Final  . Monocytes Relative 10/15/2013 12  3 - 12 % Final  . Monocytes Absolute 10/15/2013 0.7  0.1 - 1.0 K/uL Final  . Eosinophils Relative 10/15/2013 1  0 - 5 % Final  . Eosinophils Absolute 10/15/2013 0.0  0.0 - 0.7 K/uL Final  . Basophils Relative 10/15/2013 1  0 - 1 % Final  . Basophils Absolute 10/15/2013 0.0  0.0 - 0.1 K/uL Final  . Sodium 10/15/2013 138  137 - 147 mEq/L Final  . Potassium 10/15/2013 3.7  3.7 - 5.3 mEq/L Final  . Chloride 10/15/2013 99  96 - 112 mEq/L Final  . CO2 10/15/2013 26  19 - 32 mEq/L Final  . Glucose, Bld 10/15/2013 106* 70 - 99 mg/dL Final  . BUN 10/15/2013 9  6 - 23 mg/dL Final  . Creatinine, Ser 10/15/2013 0.76  0.50 - 1.35 mg/dL Final  . Calcium 10/15/2013 9.1  8.4 - 10.5 mg/dL Final  . Total Protein 10/15/2013 6.3  6.0 - 8.3 g/dL Final  . Albumin 10/15/2013 3.2* 3.5 - 5.2 g/dL Final  . AST 10/15/2013 24  0 - 37 U/L Final  . ALT 10/15/2013 9  0 - 53 U/L Final  . Alkaline Phosphatase 10/15/2013 103  39 - 117 U/L Final  . Total Bilirubin 10/15/2013 0.3  0.3 - 1.2 mg/dL Final  . GFR calc non Af Amer 10/15/2013 >90  >90 mL/min Final  . GFR calc Af Amer 10/15/2013 >90  >90 mL/min Final   Comment: (NOTE)                           The eGFR has been calculated using the CKD EPI equation.                          This calculation has not been validated in all clinical situations.                          eGFR's persistently <90 mL/min signify possible Chronic Kidney                          Disease.  . Anion gap 10/15/2013 13  5 - 15 Final  . LDH 10/15/2013 424* 94 - 250 U/L Final  . Retic Ct Pct 10/15/2013 1.5  0.4 - 3.1 % Final  . RBC. 10/15/2013 3.15* 4.22 - 5.81 MIL/uL Final  . Retic Count,  Manual 10/15/2013 47.3  19.0 - 186.0 K/uL Final  Hospital Outpatient Visit on 10/12/2013  Component Date Value Ref Range Status  . Glucose-Capillary 10/12/2013 85  70 - 99 mg/dL Final  Infusion on 09/24/2013  Component Date Value Ref Range Status  . Sodium 09/24/2013 135* 137 - 147 mEq/L Final  . Potassium 09/24/2013 4.0  3.7 - 5.3 mEq/L Final  . Chloride 09/24/2013 97  96 - 112 mEq/L Final  . CO2 09/24/2013 28  19 - 32 mEq/L Final  . Glucose, Bld 09/24/2013 114* 70 - 99 mg/dL Final  . BUN 09/24/2013 15  6 - 23 mg/dL Final  . Creatinine, Ser 09/24/2013 0.78  0.50 - 1.35 mg/dL Final  . Calcium 09/24/2013 9.4  8.4 - 10.5 mg/dL Final  . GFR calc non Af Amer 09/24/2013 >90  >90 mL/min Final  . GFR calc Af Amer 09/24/2013 >90  >90 mL/min Final   Comment: (NOTE)                          The eGFR has been calculated using the CKD EPI equation.                          This calculation has not been validated in all clinical situations.                          eGFR's persistently <90 mL/min signify possible Chronic Kidney                          Disease.  . Anion gap 09/24/2013 10  5 - 15 Final  . WBC 09/24/2013 8.3  4.0 - 10.5 K/uL Final  . RBC 09/24/2013 3.10* 4.22 - 5.81 MIL/uL Final  . Hemoglobin 09/24/2013 9.5* 13.0 - 17.0 g/dL Final  . HCT 09/24/2013 29.3* 39.0 - 52.0 % Final  . MCV 09/24/2013 94.5  78.0 - 100.0 fL Final  . MCH 09/24/2013 30.6  26.0 - 34.0 pg Final  . MCHC 09/24/2013 32.4  30.0 - 36.0 g/dL  Final  . RDW 09/24/2013 19.7* 11.5 - 15.5 % Final  . Platelets 09/24/2013 216  150 - 400 K/uL Final  . Neutrophils Relative % 09/24/2013 73  43 - 77 % Final  . Neutro Abs 09/24/2013 6.0  1.7 - 7.7 K/uL Final  . Lymphocytes Relative 09/24/2013 13  12 - 46 % Final  . Lymphs Abs 09/24/2013 1.1  0.7 - 4.0 K/uL Final  . Monocytes Relative 09/24/2013 13* 3 - 12 % Final  . Monocytes Absolute 09/24/2013 1.1* 0.1 - 1.0 K/uL Final  . Eosinophils Relative 09/24/2013 1  0 - 5 % Final  . Eosinophils Absolute 09/24/2013 0.1  0.0 - 0.7 K/uL Final  . Basophils Relative 09/24/2013 0  0 - 1 % Final  . Basophils Absolute 09/24/2013 0.0  0.0 - 0.1 K/uL Final    PATHOLOGY: Diffuse large B-cell lymphoma  Urinalysis No results found for this basename: colorurine,  appearanceur,  labspec,  phurine,  glucoseu,  hgbur,  bilirubinur,  ketonesur,  proteinur,  urobilinogen,  nitrite,  leukocytesur    RADIOGRAPHIC STUDIES: Dg Hip Complete Right  09/22/2013   CLINICAL DATA:  Right groin pain and hip pain.  EXAM: RIGHT HIP - COMPLETE 2+ VIEW  COMPARISON:  PET-CT 07/06/2013  FINDINGS: There is no evidence of hip fracture or  dislocation. Mild degenerative changes involve the right hip.  IMPRESSION: 1. No acute findings. 2. Osteoarthritis.   Electronically Signed   By: Kerby Moors M.D.   On: 09/22/2013 18:43   Nm Pet Image Restag (ps) Skull Base To Thigh  10/12/2013   CLINICAL DATA:  Subsequent treatment strategy for large B-cell lymphoma.  EXAM: NUCLEAR MEDICINE PET SKULL BASE TO THIGH  TECHNIQUE: 7.66 mCi F-18 FDG was injected intravenously. Full-ring PET imaging was performed from the skull base to thigh after the radiotracer. CT data was obtained and used for attenuation correction and anatomic localization.  FASTING BLOOD GLUCOSE:  Value: 85 mg/dl  COMPARISON:  07/16/2013  FINDINGS: NECK  Interval progression of hypermetabolic adenopathy within the neck. New level 2 lymph node on the left has an SUV max equal to  11.9. New level 2 lymph node on the right has an SUV max equal to 13.9. Progressive bilateral level 4 cervical lymph nodes are noted. SUV max associated with the left level 4 lymph nodes is equal to 11.7. Previously 3.5. SUV max associated with the right level 2 adenopathy is equal to 7.9. This is new from previous study.  CHEST  Progressive bilateral axillary adenopathy. Right axillary lymph node measures 1.9 cm and has an SUV max equal to 14.2. Previously this lymph node measured 1.3 cm and had an SUV max equal to 8. New hypermetabolic left axillary lymph node has an SUV max equal to 16.9. There has been progression of right retropectoral adenopathy. SUV max associated with the right retropectoral lymph node is equal to 8.2. This is compared with 1.9 previously. Multiple new hypermetabolic mediastinal lymph nodes identified. SUV max associated with left paratracheal lymph node is equal to 8.2. This is compared with 4.4 previously. Pre-vascular lymph nodes have an SUV max equal to 10.2. New from previous exam.Nodule within the epicardial fat overlying the right ventricle is new from previous exam within SUV max equal to 8.3. New cardio phrenic lymph nodes on the right have an SUV max equal to 9.  There are multiple pulmonary nodules identified throughout both lungs which are hypermetabolic. On the CT images these appear as a ground-glass opacities. Index nodule in the right lower lobe measures 1.7 cm and has an SUV max equal to 4.1. Left upper lobe nodule measures 1.7 cm and has an SUV max equal to 3.7.  ABDOMEN/PELVIS  No abnormal activity within the liver. The spleen is unremarkable. Normal appearance of the pancreas. The adrenal glands are both normal. Multiple foci of peripheral cortical activity noted in both kidneys suspicious for renal involvement by lymphoma. This is new from previous exam. Index lesion is in the inferior pole of the right kidney. This has an SUV max equal to 10.2. Extensive hypermetabolic  adenopathy within the upper abdomen is identified and appears progressive from the previous exam. New index peripancreatic lymph node has an SUV max equal to 18.5. Increased uptake associated with left periaortic lymph node has an SUV max equal to 10.2. Previously 4.4.  SKELETON  Multiple areas of skeletal muscle disease is identified. Hypermetabolic tumor involving the right psoas muscle measures 3.4 cm and has an SUV max equal to 8.9. Previously this measured 2.2 cm and had an SUV max equal to 5.2. Extensive hypermetabolic bone tumor metastasis is identified. Index lesion involving the right side of sacrum has an SUV max equal to 7.6. This is new from previous exam. Tumor involving the proximal right femur has an SUV max equal to 17.6.  Previously 4.1.  IMPRESSION: 1. Marked interval progression of disease involving the neck chest abdomen and pelvis. 2. Interval development of multi focal hypermetabolic pulmonary nodules consistent with pulmonary involvement by tumor. 3. New renal involvement 4. Progression of multi focal bone tumors.   Electronically Signed   By: Kerby Moors M.D.   On: 10/12/2013 13:24    ASSESSMENT:  #1. Stage IV diffuse large B-cell lymphoma with right femur involvement manifesting as generalized lymphadenopathy and right lower extremity swelling with pain, excellent improvement with no analgesic requirement whatsoever but still with residual right femur discomfort without swelling.Marland Kitchen PET scan is still mildly positive with new areas of involvement which I believe are related to his sinus problems, for additional chemotherapy day substituting mitoxantrone for doxorubicin for this cycle as well as cycle #8 due to drop in ejection fraction. Used prophylactic therapy with Zinecard for last 2 cycles, last of which was given 3 weeks ago. Most recent PET scan done on 10/12/2013 showed massive regrowth of all disease. Primary refractory diffuse large B-cell lymphoma #2. Chronic obstructive  pulmonary disease.  #3. Gastroesophageal reflux disease, controlled.  #4. Symptomatic allergic rhinitis, resolved.       PLAN:  #1. In the presence of his wife and brother-in-law, alternative interventionS at this time were discussed. These include but are not limited to more aggressive chemotherapy as an inpatient using either RICE,Hyper C-VAD, ESHAP, or DHAP; less aggressive chemotherapy with Navelbine/Gemzar; referral to a tertiary center for second opinion; or hospice care. #2. Patient was given information about Gemzar/Navelbine. #3. Vimpat 100 mg twice a day to start for peripheral neuropathy with doubling of the dose every 4 days if not controlled. #4. Fentanyl 50 patch every 3 days with oxycodone for breakthrough pain. #5. Patient was told to call back on 10/18/2013 Re: control discomfort.  #6. Followup scheduled in 2 weeks.   All questions were answered. The patient knows to call the clinic with any problems, questions or concerns. We can certainly see the patient much sooner if necessary.   I spent 40 minutes counseling the patient face to face. The total time spent in the appointment was 55 minutes.    Doroteo Bradford, MD 10/15/2013 12:22 PM  DISCLAIMER:  This note was dictated with voice recognition software.  Similar sounding words can inadvertently be transcribed inaccurately and may not be corrected upon review.

## 2013-10-15 NOTE — Patient Instructions (Addendum)
[facility] Discharge Instructions  RECOMMENDATIONS MADE BY THE CONSULTANT AND ANY TEST RESULTS WILL BE SENT TO YOUR REFERRING PHYSICIAN.  EXAM FINDINGS BY THE PHYSICIAN TODAY AND SIGNS OR SYMPTOMS TO REPORT TO CLINIC OR PRIMARY PHYSICIAN: Exam and findings as discussed by Dr. Barnet Glasgow.Scan shows progression and there are several options.  Think over your options and call us on Thursday with update on pain and what your decision is. Mickie Kay, RN  [phone])  Options: - Aggressive inpatient chemotherapy - MD not sure you can tolerate - Chemotherapy using Navelbine and Gemzar days 1 and 8 every 28 days and Prednisone days 1 - 5 every 28 days. - Referral to tertiary center such as [facility]:  Fentanyl Patches 50 mcg every 72 hours Refill for Oxycodone - take as directed  Vimpat for nerve pain - take 100 mg twice daily for 4 days if no improvement take 200 mg (2 pills) twice daily  Senokot S - 1 - 2 daily as needed to keep your bowels moving.  INSTRUCTIONS/FOLLOW-UP: Follow-up tentatively for 2 weeks.  Thank you for choosing [facility] to provide your oncology and hematology care.  To afford each patient quality time with our providers, please arrive at least 15 minutes before your scheduled appointment time.  With your help, our goal is to use those 15 minutes to complete the necessary work-up to ensure our physicians have the information they need to help with your evaluation and healthcare recommendations.    Effective January 1st, 2014, we ask that you re-schedule your appointment with our physicians should you arrive 10 or more minutes late for your appointment.  We strive to give you quality time with our providers, and arriving late affects you and other patients whose appointments are after yours.    Again, thank you for choosing [facility].  Our hope is that these requests will decrease the amount  of time that you wait before being seen by our physicians.       _____________________________________________________________  Should you have questions after your visit to [facility], please contact our office at [phone] between the hours of 8:30 a.m. and 4:30 p.m.  Voicemails left after 4:30 p.m. will not be returned until the following business day.  For prescription refill requests, have your pharmacy contact our office with your prescription refill request.    _______________________________________________________________  We hope that we have given you very good care.  You may receive a patient satisfaction survey in the mail, please complete it and return it as soon as possible.  We value your feedback!  _______________________________________________________________  Have you asked about our STAR program?  STAR stands for Survivorship Training and Rehabilitation, and this is a nationally recognized cancer care program that focuses on survivorship and rehabilitation.  Cancer and cancer treatments may cause problems, such as, pain, making you feel tired and keeping you from doing the things that you need or want to do. Cancer rehabilitation can help. Our goal is to reduce these troubling effects and help you have the best quality of life possible.  You may receive a survey from a nurse that asks questions about your current state of health.  Based on the survey results, all eligible patients will be referred to the [facility] program for an evaluation so we can better serve you!  A frequently asked questions sheet is available upon request. Vinorelbine injection What is this medicine? VINORELBINE (  vi NOR el been) is a chemotherapy drug. It targets fast dividing cells, like cancer cells, and causes these cells to die. This medicine is used to treat cancer, like lung cancer. This medicine may be used for other purposes; ask your health care provider or pharmacist if you have  questions. COMMON BRAND NAME(S): Navelbine What should I tell my health care provider before I take this medicine? They need to know if you have any of these conditions: -blood disorders -infection (especially chickenpox and herpes) -liver disease -lung disease -nervous system disease -previous or current radiation therapy -an unusual or allergic reaction to vinorelbine, other chemotherapy agents, other medicines, foods, dyes, or preservatives -pregnant or trying to get pregnant -breast-feeding How should I use this medicine? This drug is given as an infusion into a vein. It is administered in a hospital or clinic by a specially trained health care professional. If you have pain, swelling, burning or any unusual feeling around the site of your injection, tell your health care professional right away. Talk to your pediatrician regarding the use of this medicine in children. Special care may be needed. Overdosage: If you think you have taken too much of this medicine contact a poison control center or emergency room at once. NOTE: This medicine is only for you. Do not share this medicine with others. What if I miss a dose? It is important not to miss your dose. Call your doctor or health care professional if you are unable to keep an appointment. What may interact with this medicine? Do not take this medicine with any of the following medications: -itraconazole -voriconazole This medicine may also interact with the following medications: -cyclosporine -erythromycin -fluconazole -ketoconazole -medicines for HIV like delavirdine, efavirenz, nevirapine -medicines for seizures like ethotoin, fosphenotoin, phenytoin -medicines to increase blood counts like filgrastim, pegfilgrastim, sargramostim -other chemotherapy drugs like cisplatin, mitomycin, paclitaxel -vaccines Talk to your doctor or health care professional before taking any of these  medicines: -acetaminophen -aspirin -ibuprofen -ketoprofen -naproxen This list may not describe all possible interactions. Give your health care provider a list of all the medicines, herbs, non-prescription drugs, or dietary supplements you use. Also tell them if you smoke, drink alcohol, or use illegal drugs. Some items may interact with your medicine. What should I watch for while using this medicine? Your condition will be monitored carefully while you are receiving this medicine. You will need important blood work done while you are taking this medicine. This drug may make you feel generally unwell. This is not uncommon, as chemotherapy can affect healthy cells as well as cancer cells. Report any side effects. Continue your course of treatment even though you feel ill unless your doctor tells you to stop. In some cases, you may be given additional medicines to help with side effects. Follow all directions for their use. Call your doctor or health care professional for advice if you get a fever, chills or sore throat, or other symptoms of a cold or flu. Do not treat yourself. This drug decreases your body's ability to fight infections. Try to avoid being around people who are sick. This medicine may increase your risk to bruise or bleed. Call your doctor or health care professional if you notice any unusual bleeding. Be careful brushing and flossing your teeth or using a toothpick because you may get an infection or bleed more easily. If you have any dental work done, tell your dentist you are receiving this medicine. Avoid taking products that contain aspirin, acetaminophen, ibuprofen,  naproxen, or ketoprofen unless instructed by your doctor. These medicines may hide a fever. Do not become pregnant while taking this medicine. Women should inform their doctor if they wish to become pregnant or think they might be pregnant. There is a potential for serious side effects to an unborn child. Talk to  your health care professional or pharmacist for more information. Do not breast-feed an infant while taking this medicine. What side effects may I notice from receiving this medicine? Side effects that you should report to your doctor or health care professional as soon as possible: -allergic reactions like skin rash, itching or hives, swelling of the face, lips, or tongue -low blood counts - This drug may decrease the number of white blood cells, red blood cells and platelets. You may be at increased risk for infections and bleeding. -signs of infection - fever or chills, cough, sore throat, pain or difficulty passing urine -signs of decreased platelets or bleeding - bruising, pinpoint red spots on the skin, black, tarry stools, nosebleeds -signs of decreased red blood cells - unusually weak or tired, fainting spells, lightheadedness -breathing problems -chest pain -constipation -cough -mouth sores -nausea and vomiting -pain, swelling, redness or irritation at the injection site -pain, tingling, numbness in the hands or feet -stomach pain -trouble passing urine or change in the amount of urine Side effects that usually do not require medical attention (report to your doctor or health care professional if they continue or are bothersome): -diarrhea -hair loss -jaw pain -loss of appetite This list may not describe all possible side effects. Call your doctor for medical advice about side effects. You may report side effects to FDA at 1-800-FDA-1088. Where should I keep my medicine? This drug is given in a hospital or clinic and will not be stored at home. NOTE: This sheet is a summary. It may not cover all possible information. If you have questions about this medicine, talk to your doctor, pharmacist, or health care provider.  2015, [employer]vier/Gold Standard. (2007-10-16 17:18:15) Gemcitabine injection What is this medicine? GEMCITABINE (jem SIT a been) is a chemotherapy drug. This  medicine is used to treat many types of cancer like breast cancer, lung cancer, pancreatic cancer, and ovarian cancer. This medicine may be used for other purposes; ask your health care provider or pharmacist if you have questions. COMMON BRAND NAME(S): Gemzar What should I tell my health care provider before I take this medicine? They need to know if you have any of these conditions: -blood disorders -infection -kidney disease -liver disease -recent or ongoing radiation therapy -an unusual or allergic reaction to gemcitabine, other chemotherapy, other medicines, foods, dyes, or preservatives -pregnant or trying to get pregnant -breast-feeding How should I use this medicine? This drug is given as an infusion into a vein. It is administered in a hospital or clinic by a specially trained health care professional. Talk to your pediatrician regarding the use of this medicine in children. Special care may be needed. Overdosage: If you think you have taken too much of this medicine contact a poison control center or emergency room at once. NOTE: This medicine is only for you. Do not share this medicine with others. What if I miss a dose? It is important not to miss your dose. Call your doctor or health care professional if you are unable to keep an appointment. What may interact with this medicine? -medicines to increase blood counts like filgrastim, pegfilgrastim, sargramostim -some other chemotherapy drugs like cisplatin -vaccines Talk to your  doctor or health care professional before taking any of these medicines: -acetaminophen -aspirin -ibuprofen -ketoprofen -naproxen This list may not describe all possible interactions. Give your health care provider a list of all the medicines, herbs, non-prescription drugs, or dietary supplements you use. Also tell them if you smoke, drink alcohol, or use illegal drugs. Some items may interact with your medicine. What should I watch for while using  this medicine? Visit your doctor for checks on your progress. This drug may make you feel generally unwell. This is not uncommon, as chemotherapy can affect healthy cells as well as cancer cells. Report any side effects. Continue your course of treatment even though you feel ill unless your doctor tells you to stop. In some cases, you may be given additional medicines to help with side effects. Follow all directions for their use. Call your doctor or health care professional for advice if you get a fever, chills or sore throat, or other symptoms of a cold or flu. Do not treat yourself. This drug decreases your body's ability to fight infections. Try to avoid being around people who are sick. This medicine may increase your risk to bruise or bleed. Call your doctor or health care professional if you notice any unusual bleeding. Be careful brushing and flossing your teeth or using a toothpick because you may get an infection or bleed more easily. If you have any dental work done, tell your dentist you are receiving this medicine. Avoid taking products that contain aspirin, acetaminophen, ibuprofen, naproxen, or ketoprofen unless instructed by your doctor. These medicines may hide a fever. Women should inform their doctor if they wish to become pregnant or think they might be pregnant. There is a potential for serious side effects to an unborn child. Talk to your health care professional or pharmacist for more information. Do not breast-feed an infant while taking this medicine. What side effects may I notice from receiving this medicine? Side effects that you should report to your doctor or health care professional as soon as possible: -allergic reactions like skin rash, itching or hives, swelling of the face, lips, or tongue -low blood counts - this medicine may decrease the number of white blood cells, red blood cells and platelets. You may be at increased risk for infections and bleeding. -signs of  infection - fever or chills, cough, sore throat, pain or difficulty passing urine -signs of decreased platelets or bleeding - bruising, pinpoint red spots on the skin, black, tarry stools, blood in the urine -signs of decreased red blood cells - unusually weak or tired, fainting spells, lightheadedness -breathing problems -chest pain -mouth sores -nausea and vomiting -pain, swelling, redness at site where injected -pain, tingling, numbness in the hands or feet -stomach pain -swelling of ankles, feet, hands -unusual bleeding Side effects that usually do not require medical attention (report to your doctor or health care professional if they continue or are bothersome): -constipation -diarrhea -hair loss -loss of appetite -stomach upset This list may not describe all possible side effects. Call your doctor for medical advice about side effects. You may report side effects to FDA at 1-800-FDA-1088. Where should I keep my medicine? This drug is given in a hospital or clinic and will not be stored at home. NOTE: This sheet is a summary. It may not cover all possible information. If you have questions about this medicine, talk to your doctor, pharmacist, or health care provider.  2015, [employer]vier/Gold Standard. (2007-05-30 18:45:54)

## 2013-10-16 ENCOUNTER — Telehealth (HOSPITAL_COMMUNITY): Payer: Self-pay

## 2013-10-16 NOTE — Telephone Encounter (Signed)
Call from patient wanting to know if fentanyl patch could cause dizziness and slight headache.  Instructed that it could but symptoms should resolve over time but to get balance before walking.  To call if symptoms worsen.

## 2013-10-17 LAB — BETA 2 MICROGLOBULIN, SERUM: Beta-2 Microglobulin: 4.14 mg/L — ABNORMAL HIGH (ref <=2.51)

## 2013-10-18 ENCOUNTER — Encounter (HOSPITAL_COMMUNITY): Payer: Self-pay | Admitting: Lab

## 2013-10-18 ENCOUNTER — Telehealth (HOSPITAL_COMMUNITY): Payer: Self-pay

## 2013-10-18 NOTE — Progress Notes (Signed)
Referral sent to Hospice on 9/17

## 2013-10-18 NOTE — Telephone Encounter (Signed)
Call from patient, states "I've read over all the information about the chemotherapy and have talked with my family and I've decided I don't want to take the chemotherapy.  I'm ok for a Hospice Referral now.  Instructed that we would make the referral and that hospice can flush his port at home and if he wants to cancel the appointment with the MD for 9/28 he can.  Patient will let us know closer to the appointment time.

## 2013-10-19 ENCOUNTER — Telehealth (HOSPITAL_COMMUNITY): Payer: Self-pay

## 2013-10-22 NOTE — Telephone Encounter (Signed)
Telephone encounter.

## 2013-10-29 ENCOUNTER — Ambulatory Visit (HOSPITAL_COMMUNITY): Payer: Medicare Other

## 2013-11-01 DEATH — deceased

## 2013-11-06 ENCOUNTER — Encounter (HOSPITAL_COMMUNITY): Payer: Medicare Other

## 2015-12-17 IMAGING — US US EXTREM LOW VENOUS*R*
1 series · 14 of 24 positions shown · non-contrast
Comparison: None.

CLINICAL DATA: Right leg pain and swelling.

EXAM:
Right LOWER EXTREMITY VENOUS DOPPLER ULTRASOUND
TECHNIQUE: Gray-scale sonography with graded compression, as well as color
Doppler and duplex ultrasound, were performed to evaluate the deep
venous system from the level of the common femoral vein through the
popliteal and proximal calf veins. Spectral Doppler was utilized to
evaluate flow at rest and with distal augmentation maneuvers.

[Series 1: us extrem low venous*right* · 0.09mm/px · 14 of 39 slices shown]
[im 1/39]
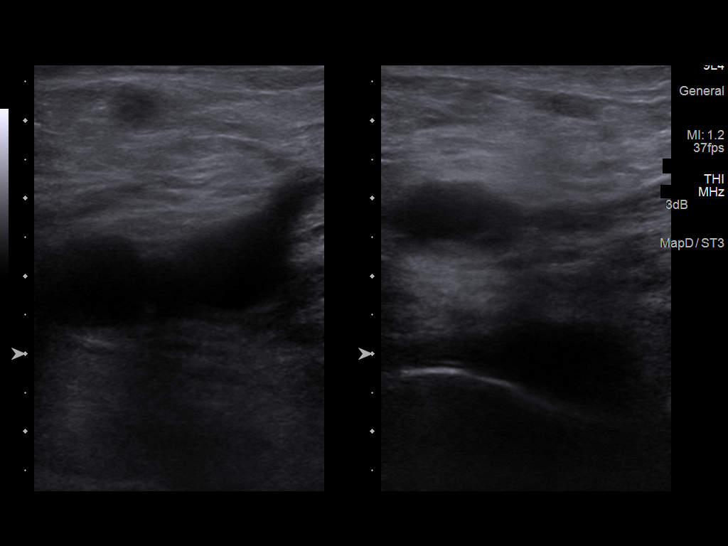
[im 4/39]
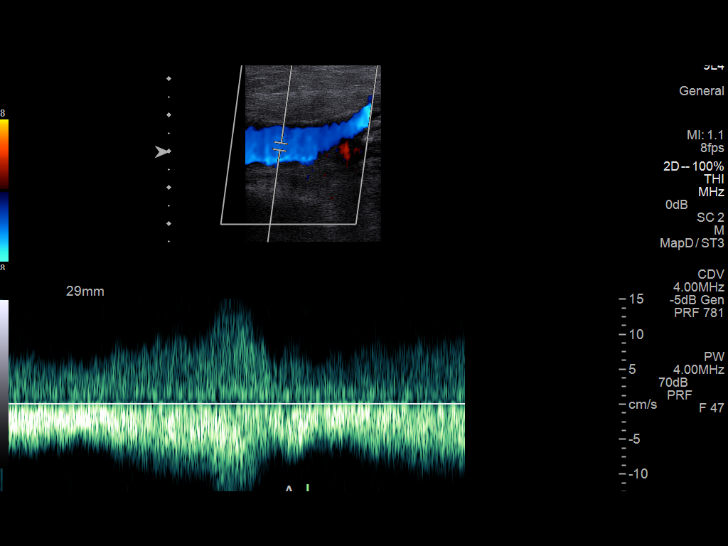
[im 7/39]
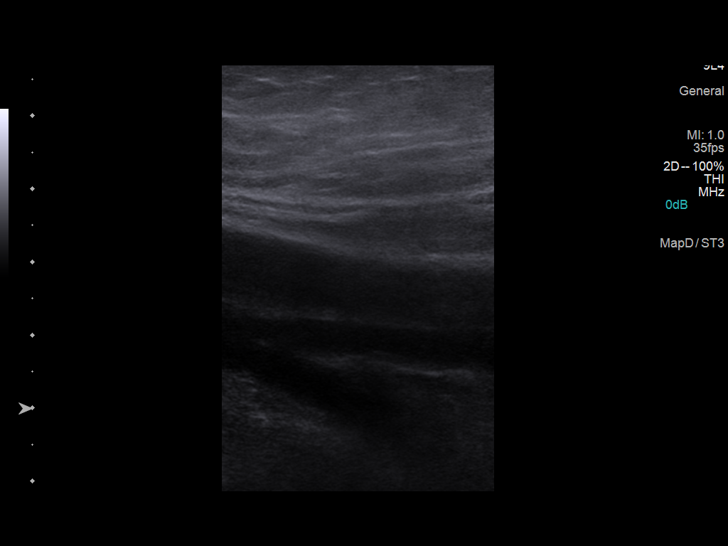
[im 10/39]
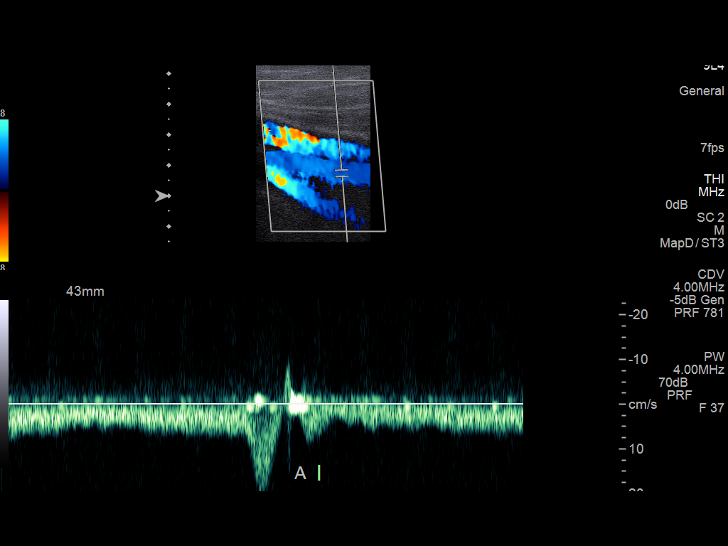
[im 12/39]
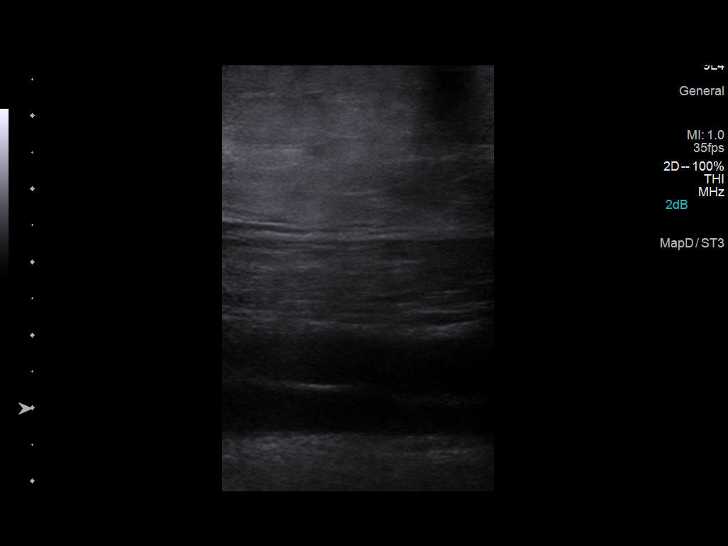
[im 15/39]
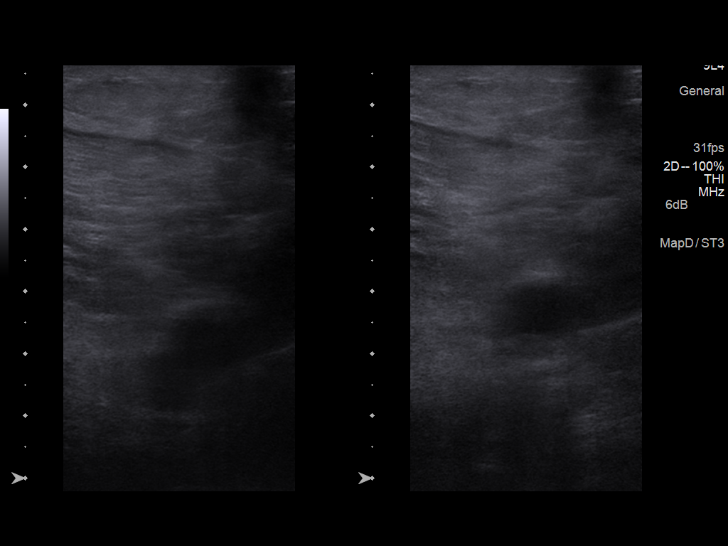
[im 19/39]
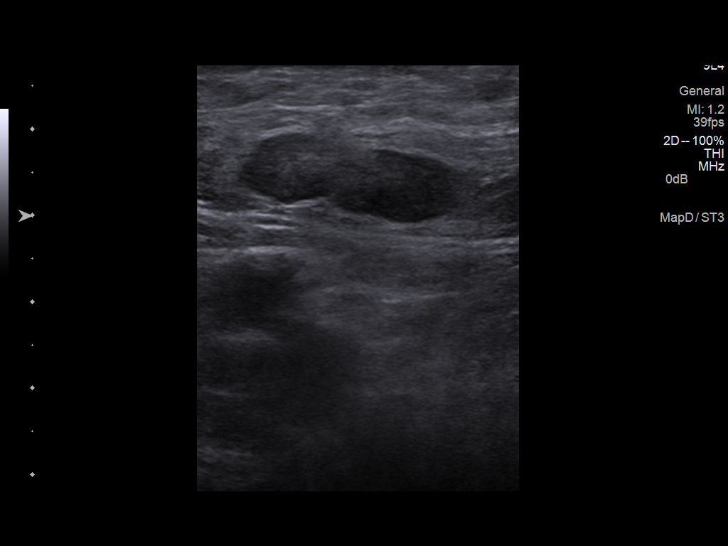
[im 20/39]
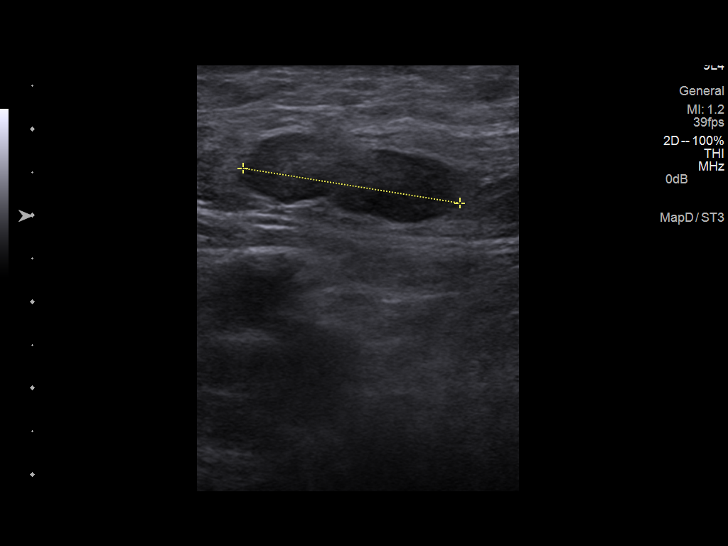
[im 24/39]
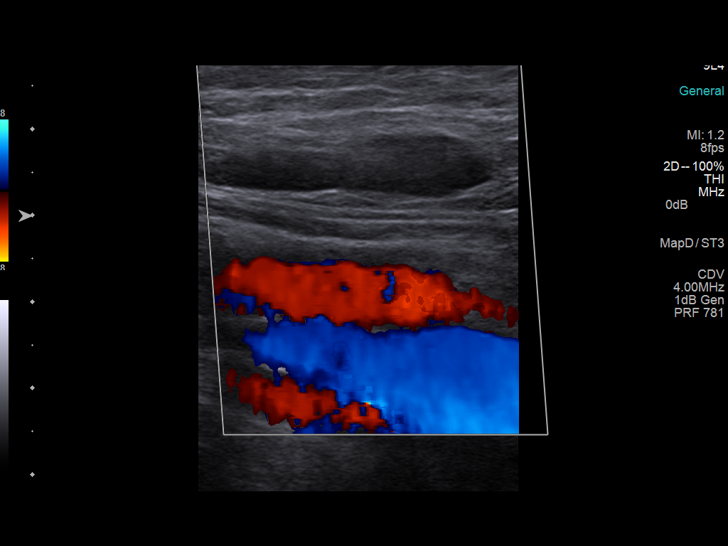
[im 27/39]
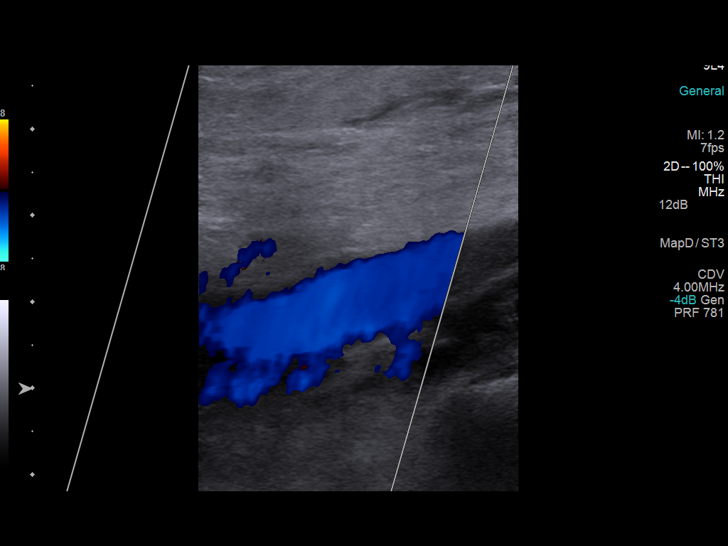
[im 30/39]
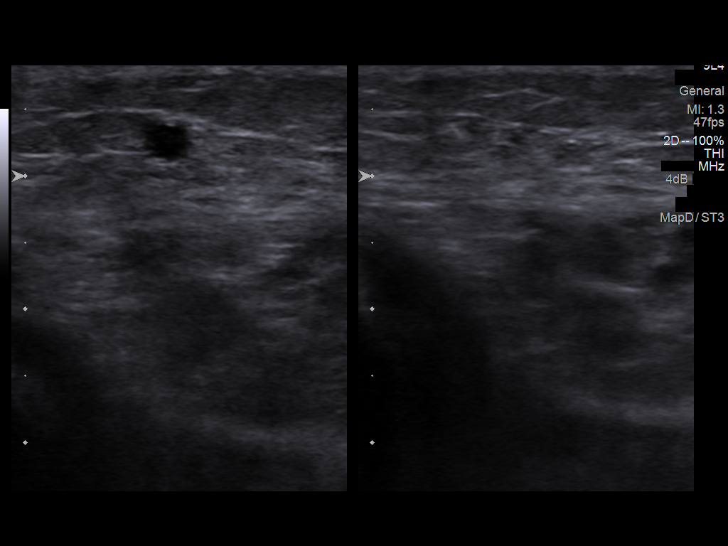
[im 32/39]
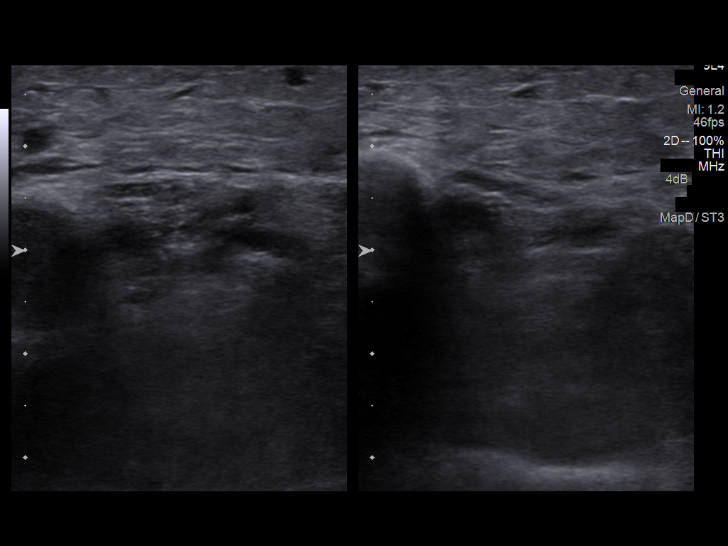
[im 35/39]
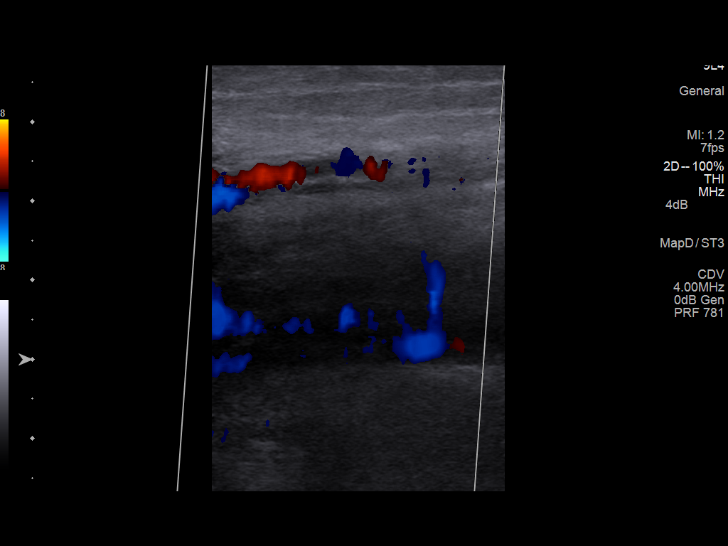
[im 39/39]
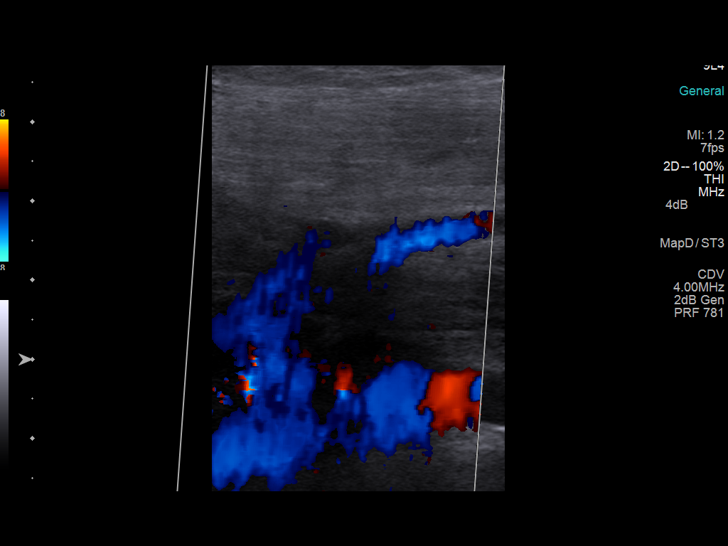

[14 of 24 positions shown; findings below may reference images not displayed]

FINDINGS: Thrombus within deep veins:  None visualized.

Compressibility of deep veins:  Normal.

Duplex waveform respiratory phasicity:  Normal.

Duplex waveform response to augmentation:  Normal.

Venous reflux:  Not evaluated.

Other findings: The posterior tibial and peroneal veins appear
patent where visualized below the knee.

Subcutaneous edema is visualized in the leg.

Mildly enlarged lymph node identified in the right groin region.
IMPRESSION: No evidence for DVT in the right lower extremity.

Mildly enlarged right inguinal lymph node.

Subcutaneous edema in the leg.

## 2015-12-19 IMAGING — CT CT ABD-PELV W/ CM
2 of 5 series · 15 of 46 positions shown, 17 images · IV contrast (Omnipaque 300)
Comparison: None.

CLINICAL DATA: Right hip and thigh pain

EXAM:
CT ABDOMEN AND PELVIS WITH CONTRAST
TECHNIQUE: Multidetector CT imaging of the abdomen and pelvis was performed
using the standard protocol following bolus administration of
intravenous contrast.
CONTRAST:  100mL OMNIPAQUE IOHEXOL 300 MG/ML  SOLN

[Series 2: abd_pel_with 5.0 b40f · axial · 0.68mm/px · z∈[-419,-19]mm · 12 of 90 slices shown, 14 images]
[im 5/90  soft-tissue]
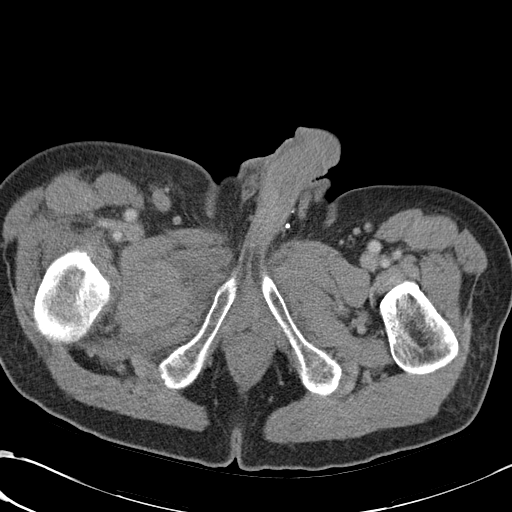
[im 5/90  bone]
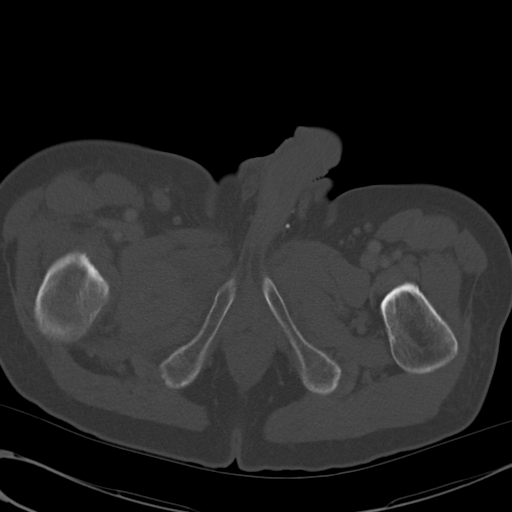
[im 15/90  soft-tissue]
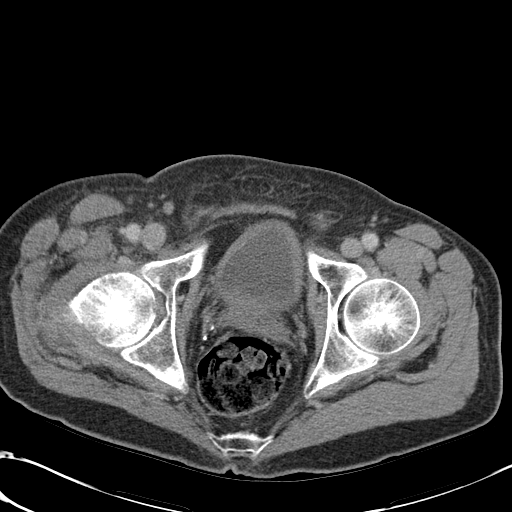
[im 20/90  soft-tissue]
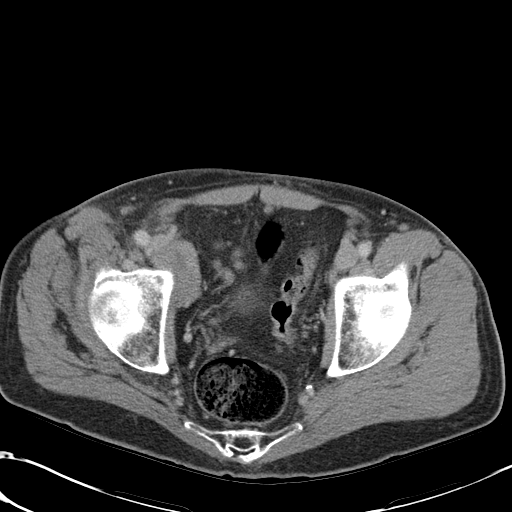
[im 25/90  soft-tissue]
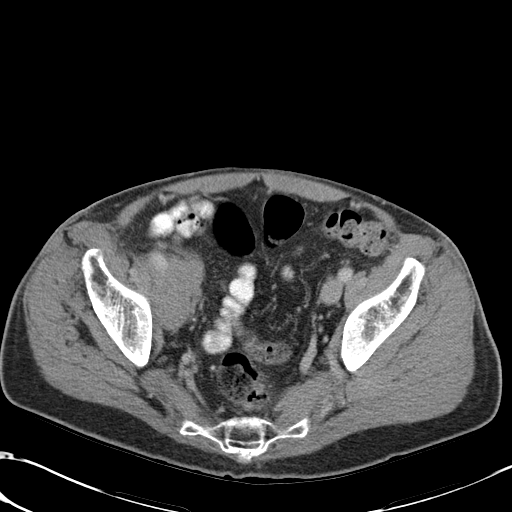
[im 35/90  soft-tissue]
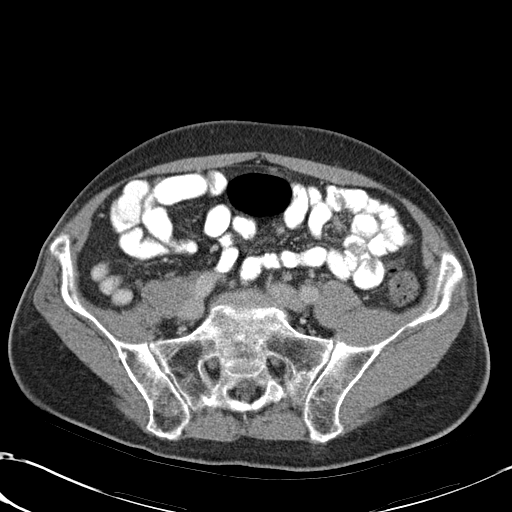
[im 40/90  soft-tissue]
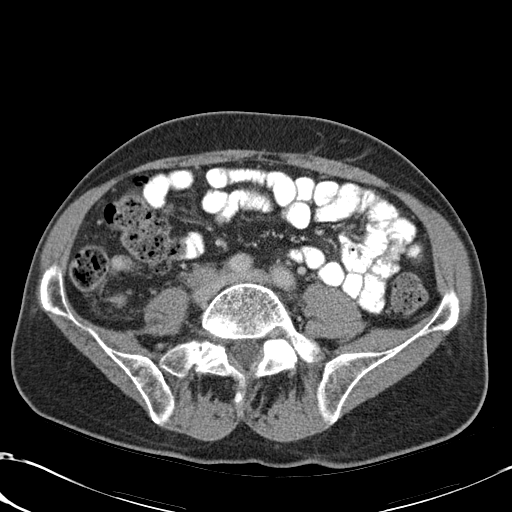
[im 50/90  soft-tissue]
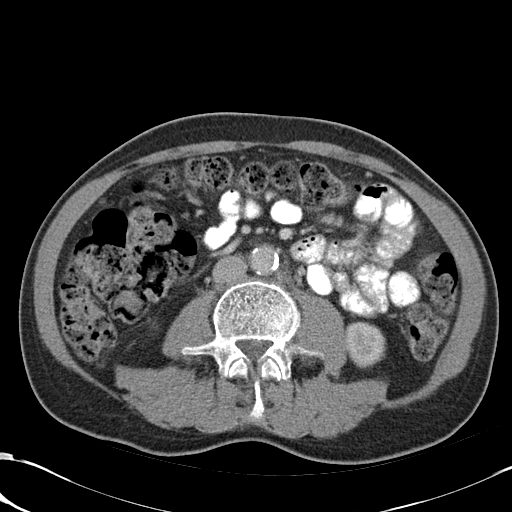
[im 55/90  soft-tissue]
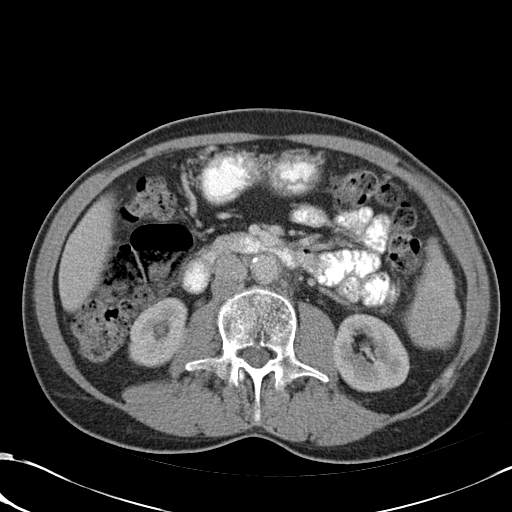
[im 65/90  soft-tissue]
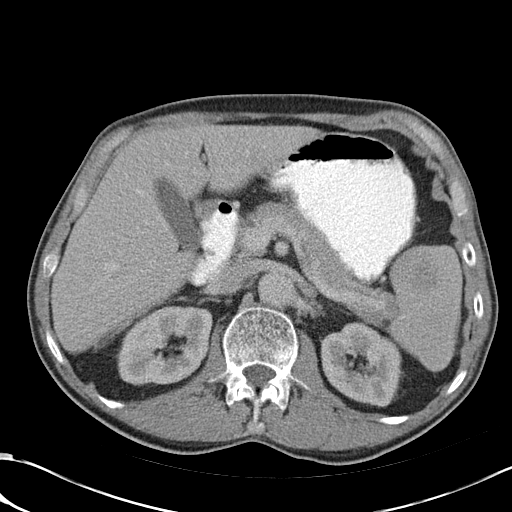
[im 65/90  bone]
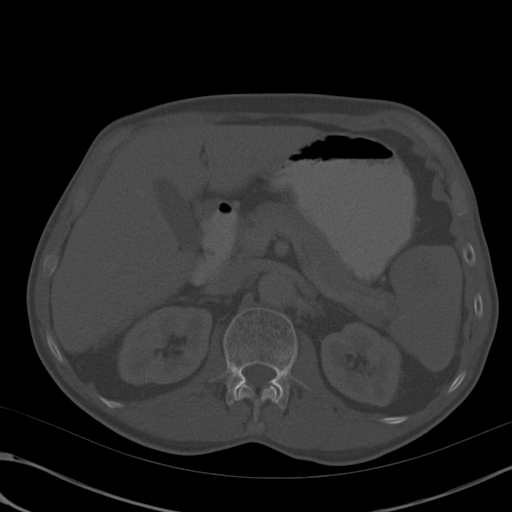
[im 70/90  soft-tissue]
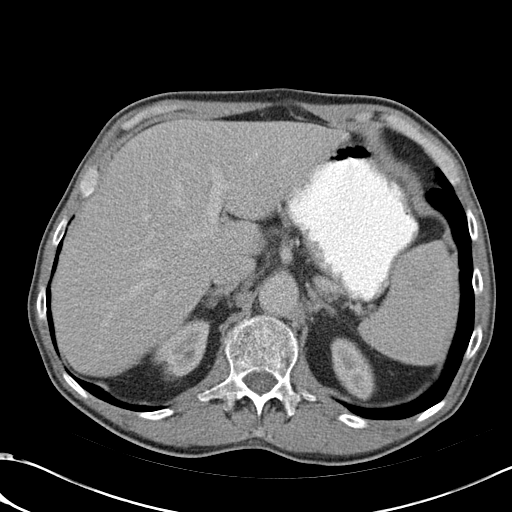
[im 75/90  soft-tissue]
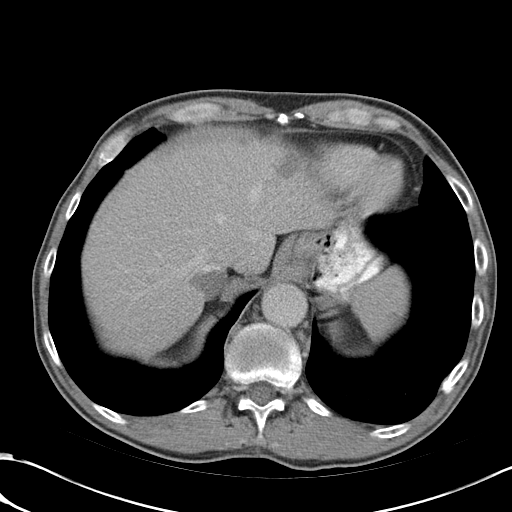
[im 85/90  soft-tissue]
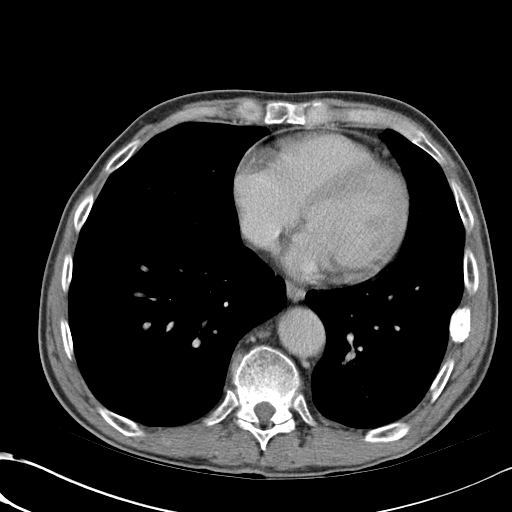

[Series 5: abd_pel_with 3.0 spo cor · coronal · 0.64mm/px · 3 of 79 slices shown]
[im 27/79  soft-tissue]
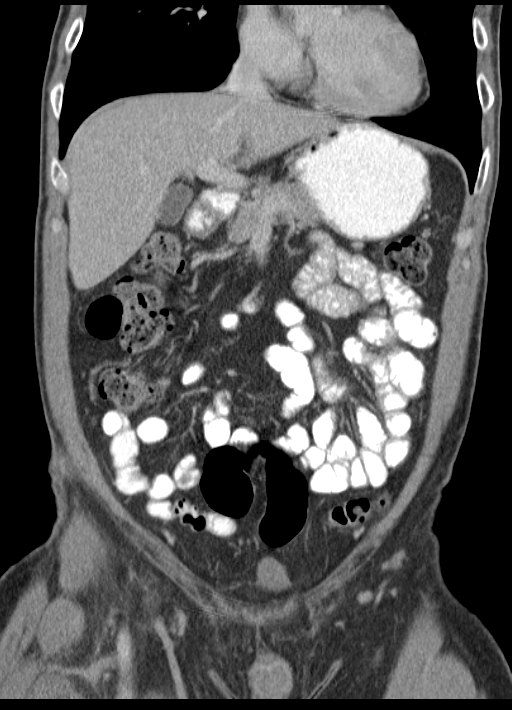
[im 35/79  soft-tissue]
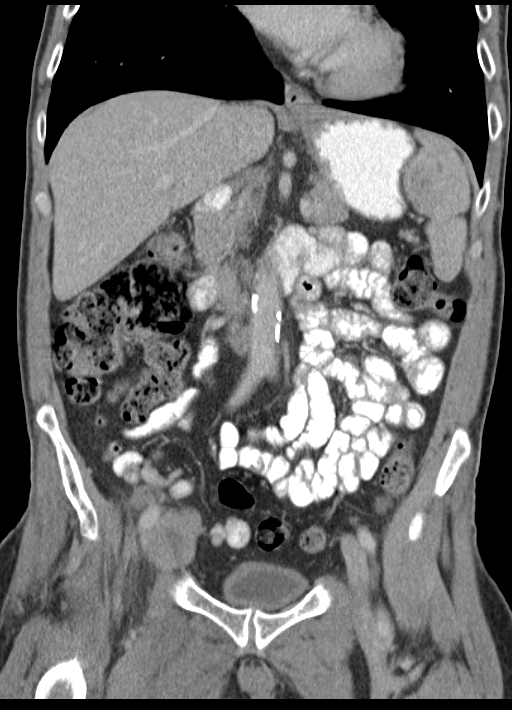
[im 44/79  soft-tissue]
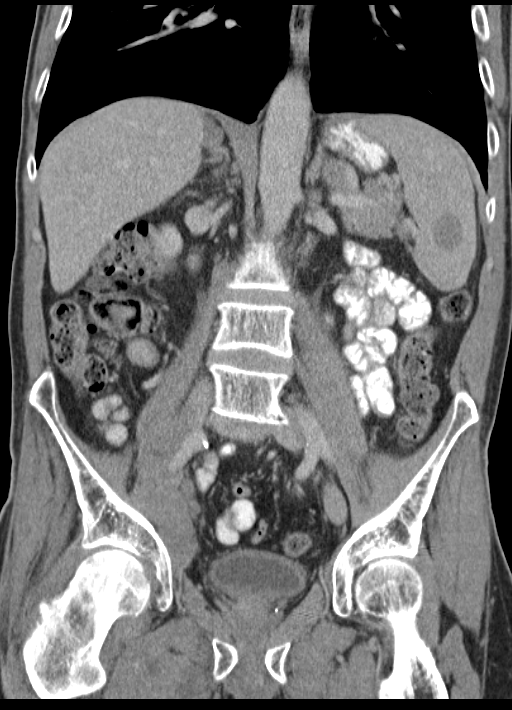

[15 of 46 positions shown; findings below may reference images not displayed]

FINDINGS: No pleural or pericardial effusion identified. The lung bases are
clear. Mild changes of centrilobular emphysema identified. .

1.9 cm low attenuation structure within the left hepatic lobe is
indeterminate, image 16/series 2. There is a indeterminate low
attenuation structure in the right hepatic lobe measuring 1.3 cm,
image 18/series 2. Within the medial aspect of the posterior right
hepatic lobe there is a 1.7 x 2.2 cm indeterminate low attenuation
lesion, image 16/series 2. The gallbladder appears normal. No
biliary dilatation. Normal appearance of the pancreas. The spleen is
normal in size measuring 11 cm. There are several foci of low
attenuation within the spleen. The largest 3.5 cm, image 40/series
5.

The adrenal glands are both normal. Normal appearance of both
kidneys. The urinary bladder is normal.

Calcification within the prostate gland is noted.

Calcified atherosclerotic disease involves the abdominal aorta.
There is no aneurysm. Periaortic lymph node/soft tissue measures
cm, image 46/series 2. There is a 1 cm aortocaval lymph node, image
44/series 2. Multiple enlarged right iliac lymph nodes are
identified. Index right common iliac lymph node measures 1.8 cm,
image 53/series 2. Large right external iliac lymph node measures
3.6 cm, image 67/series 2. Review of the bony structures shows
definite evidence for lytic or sclerotic bone lesion.

There is no ascites or focal fluid collections identified within the
abdomen or pelvis. The stomach appears normal. The small bowel loops
have a normal course and caliber and there is no evidence for
obstruction. The colon is on unremarkable.

Hyperdense mass within the medial aspect of the right upper thigh a
measures 4.5 x 5.6 cm, image 88/series 2.
IMPRESSION: 1. Multifocal areas of low attenuation within the liver and spleen
are worrisome for metastatic disease.
2. Enlarged retroperitoneal and right iliac lymph nodes are
worrisome for metastatic adenopathy.
3. Indeterminate mass within the medial aspect of the right upper
thigh is also favored to represent a focus of metastatic disease.
4. Further evaluation with PET-CT and tissue sampling is
recommended.
